# Patient Record
Sex: Female | Born: 1935 | Race: White | Hispanic: No | Marital: Married | Smoking: Never smoker
Health system: Southern US, Community
[De-identification: ages and names within clinical notes are randomized; demographics above are authoritative.]

## PROBLEM LIST (undated history)

## (undated) DIAGNOSIS — E039 Hypothyroidism, unspecified: Secondary | ICD-10-CM

## (undated) DIAGNOSIS — G309 Alzheimer's disease, unspecified: Secondary | ICD-10-CM

## (undated) DIAGNOSIS — F0280 Dementia in other diseases classified elsewhere without behavioral disturbance: Secondary | ICD-10-CM

## (undated) DIAGNOSIS — R296 Repeated falls: Secondary | ICD-10-CM

## (undated) DIAGNOSIS — E785 Hyperlipidemia, unspecified: Secondary | ICD-10-CM

## (undated) HISTORY — PX: ABDOMINAL HYSTERECTOMY: SHX81

## (undated) HISTORY — PX: OTHER SURGICAL HISTORY: SHX169

## (undated) HISTORY — DX: Hyperlipidemia, unspecified: E78.5

## (undated) HISTORY — PX: JOINT REPLACEMENT: SHX530

---

## 1999-10-21 ENCOUNTER — Other Ambulatory Visit: Admission: RE | Admit: 1999-10-21 | Discharge: 1999-10-21 | Payer: Self-pay | Admitting: Family Medicine

## 2000-07-27 ENCOUNTER — Ambulatory Visit (HOSPITAL_COMMUNITY): Admission: RE | Admit: 2000-07-27 | Discharge: 2000-07-27 | Payer: Self-pay | Admitting: Family Medicine

## 2005-04-01 ENCOUNTER — Ambulatory Visit: Payer: Self-pay | Admitting: Cardiology

## 2005-04-03 ENCOUNTER — Ambulatory Visit: Payer: Self-pay | Admitting: Cardiology

## 2005-04-22 ENCOUNTER — Ambulatory Visit: Payer: Self-pay | Admitting: Cardiology

## 2005-10-08 ENCOUNTER — Ambulatory Visit: Payer: Self-pay | Admitting: Cardiology

## 2005-10-09 ENCOUNTER — Inpatient Hospital Stay (HOSPITAL_COMMUNITY): Admission: AD | Admit: 2005-10-09 | Discharge: 2005-10-10 | Payer: Self-pay | Admitting: Cardiology

## 2005-10-09 ENCOUNTER — Ambulatory Visit: Payer: Self-pay | Admitting: Cardiology

## 2005-10-31 ENCOUNTER — Ambulatory Visit: Payer: Self-pay | Admitting: Cardiology

## 2006-02-17 ENCOUNTER — Ambulatory Visit (HOSPITAL_COMMUNITY): Admission: RE | Admit: 2006-02-17 | Discharge: 2006-02-18 | Payer: Self-pay | Admitting: Ophthalmology

## 2006-07-09 ENCOUNTER — Ambulatory Visit: Payer: Self-pay | Admitting: Cardiology

## 2006-07-14 ENCOUNTER — Other Ambulatory Visit: Admission: RE | Admit: 2006-07-14 | Discharge: 2006-07-14 | Payer: Self-pay | Admitting: Family Medicine

## 2006-07-22 ENCOUNTER — Ambulatory Visit: Payer: Self-pay | Admitting: Cardiology

## 2009-02-07 ENCOUNTER — Encounter
Admission: RE | Admit: 2009-02-07 | Discharge: 2009-03-29 | Payer: Self-pay | Admitting: Physical Medicine and Rehabilitation

## 2010-08-16 NOTE — Discharge Summary (Signed)
NAMEMarland Kitchen  [name], Donna [name] NO.:  [id number]   MEDICAL RECORD NO.:  [id number]          PATIENT TYPE:  INP   LOCATION:  4705                         FACILITY:  [facility]   PHYSICIAN:  Dorian Pod, NP    DATE OF BIRTH:  1930-02-15   DATE OF ADMISSION:  10/09/2005  DATE OF DISCHARGE:                                 DISCHARGE SUMMARY   DATE OF DISCHARGE:  October 10, 2005   PRIMARY CARE:  Ernestina Penna, M.D.   CARDIOLOGIST:  Jonelle Sidle, M.D. [facility]   DISCHARGING DIAGNOSIS:  Chest pain status post cardiac catheterization this  admission, show normal coronary arteries, normal left ventricular function.  Moderately severe aortic stenosis.   PROCEDURES THIS ADMISSION:  Cardiac catheterization.   PAST MEDICAL HISTORY:  1.  Lower extremity several years.  2.  Cellulitis.  3.  Hyperlipidemia.  4.  Hypothyroidism.  5.  Bradycardia.   HOSPITAL COURSE:  [name] is a 77 year old Caucasian female initially seen  by Dr. Diona Browner back in January 2007, secondary to chest discomfort.  A 2D  echocardiogram at that time confirmed presence of moderate to moderately  severe aortic stenosis.  Cardiolite stress demonstrated no ischemia.  Patient was placed on aspirin, Lipitor, but no further workup was requested.  This admission, patient transferred from [facility]  to [facility] complaining of chest discomfort.  Dr. Andee Lineman saw  patient in consultation at [facility].  Patient's cardiac enzymes were  negative.  EKG demonstrated sinus brady with a nonspecific ST-T wave  changes.  Dr. Andee Lineman was concerned about patient's substernal chest pain.  He scheduled her for a right and left heart cath at [facility].  Patient was  transferred to [facility] on aspirin and Lovenox.  The patient to the cath  lab on October 09, 2005, for a left and right heart cath.  Hemodynamically,  patient had a cardiac output/cardiac index by Fick of 3.3/1.9, aortic brady  at  18.4 mean, aortic valve area 0.84.  Patient had normal coronaries, normal  ventricular function with moderately severe aortic stenosis.  At this time,  patient's symptoms do not suggest an indication for valve replacement.  Patient will be followed clinically.  Dr. Milinda Cave to see patient on day of  discharge.  Patient is stable.  Hemoglobin 12.6, hematocrit 38, BUN 9,  creatinine 0.8, TSH elevated at 23.597.  Patient in sinus rhythm, afebrile,  blood pressure 118/74.  Patient being discharged home to follow up with  doctor.   PATIENT'S MEDICATIONS AT TIME OF DISCHARGE:  1.  Levoxyl, which has been increased to 175 mcg daily.  Patient has a      prescription for this.  2.  Lipitor 10 mg daily.  3.  Aspirin 81 mg daily.  She has been given the post cardiac catheterization discharge instructions.  She is to follow up with __________  at  12:30.  Follow up with Dr. Christell Constant, patient to call for an appointment.  She  will need to have a TSH level drawn in 4 to 6 weeks. This can be done by Dr.  Moore in [place].  Duration of discharge encounter 25 minutes.      Dorian Pod, NP     MB/MEDQ  D:  10/10/2005  T:  10/11/2005  Job:  [id number]

## 2010-08-16 NOTE — Op Note (Signed)
NAME:  Donna [name], Donna [name]             ACCOUNT NO.:  [id number]   MEDICAL RECORD NO.:  [id number]          PATIENT TYPE:  OIB   LOCATION:  2550                         FACILITY:  [facility]   PHYSICIAN:  Beulah Gandy. Ashley Royalty, M.D. DATE OF BIRTH:  04-Jun-1929   DATE OF PROCEDURE:  02/17/2006  DATE OF DISCHARGE:                                 OPERATIVE REPORT   ADMISSION DIAGNOSIS:  Retained lens fragments in the right eye; glaucoma,  right eye.   PROCEDURES:  Pars plana vitrectomy, retinal photocoagulation, membrane peel,  right eye.   SURGEON:  Alan Mulder, M.D.   ASSISTANT:  Rosalie Doctor, MA   ANESTHESIA:  General.   DETAILS:  Usual prep and drape.  The indirect ophthalmoscope laser was moved  into place, two rows of laser coagulation were placed around the retinal  periphery with a power of 540 milliwatts, 690 burns, 1000 microns each and  0.07 seconds each.  Attention was carried to the pars plana area were 25  gauge trocars were placed for 4 mL behind the limbus at 8, 10 and 2 o'clock.  Infusion at 8 o'clock.  The lighted pick and the cutter were placed at 10  and 2 o'clock respectively.  Pars plana vitrectomy was begun in the  pupillary axis, lens material was seen and 12 o'clock around the IOL.  This  was carefully removed with low suction and rapid cutting.  The vitrectomy is  carried posteriorly where nuclear fragments and cortical fragments were  seen.  These were carefully removed under low suction and rapid cutting.  Scleral depression was used to gain access to the vitreous base area where  additional lens fragments were encountered and removed.  Once all vitreous  and particles were removed.  The instruments were removed from the eye.  The  trocars were removed.  The wounds were held tightly until they were sealed.  Polymyxin and gentamicin were irrigated into Tenon's space.  Marcaine was  injected around the globe for postop pain.  Decadron 10 mg was injected to  the lower  subconjunctival space.  TobraDex ophthalmic ointment, a patch and  shield were placed.  The closing pressure was 15 with a Barraquer tonometer.  Complications none.  Duration 1/2 hour.  The patient is awakened, taken to  recovery in satisfactory condition.      Beulah Gandy. Ashley Royalty, M.D.  Electronically Signed     JDM/MEDQ  D:  02/17/2006  T:  02/17/2006  Job:  [id number]

## 2010-08-16 NOTE — Cardiovascular Report (Signed)
NAME:  Donna [name], Donna [name] NO.:  [id number]   MEDICAL RECORD NO.:  [id number]          PATIENT TYPE:  INP   LOCATION:  4705                         FACILITY:  [facility]   PHYSICIAN:  Rollene Rotunda, M.D.   DATE OF BIRTH:  12-Jan-1930   DATE OF PROCEDURE:  10/09/2005  DATE OF DISCHARGE:                              CARDIAC CATHETERIZATION   PRIMARY CARE PHYSICIAN:  Montey Hora, M.D.   PROCEDURE:  Left and right heart catheterization/coronary angiography.   INDICATIONS FOR PROCEDURE:  Evaluate patient with aortic stenosis, chest  pain suggestive of unstable angina.   DESCRIPTION OF PROCEDURE:  Left heart catheterization was performed via the  right femoral artery, right heart catheterization performed via the right  femoral vein. Both vessels were cannulated, each using the anterior wall  puncture. A #6 French arterial sheath and a #7 [place] venous sheath were  inserted via the modified Seldinger technique. Pre-form Judkins, a pigtail,  and a Swan Ganz catheter were utilized. The patient tolerated the procedure  well and left the lab in stable condition.   RESULTS:  HEMODYNAMICS:  RV mean 12, RV 24/1, PA 22/8 with a mean of 14,  pulmonary capillary wedge pressure mean 6, LV 139/9, AO 116/66, cardiac  output/cardiac index (FICK) 3.3/1.9. Aortic gradient 18.4 mean, aortic valve  area 0.84.   CORONARIES:  The left main was normal. The LAD had mild luminal  irregularities. The first diagonal was moderate size and normal. There was a  circumflex, which was very large. It was essentially an OM and normal. The  right coronary artery was a large dominant vessel. There were diffuse  luminal irregularities. The PDA was moderate size and normal. The  posterolateral was moderate size and normal.   LEFT VENTRICULOGRAM:  The left ventriculogram was obtained in the RAO  projection. The EF was 60% with normal wall motion.   CONCLUSION:  Normal coronary arteries. Normal left  ventricular function.  There is moderately severe aortic stenosis.   PLAN:  I have discussed this with Dr. Andee Lineman. At this point, her symptoms do  not suggest an indication for valve replacement. We can follow this  clinically. She will follow up with Dr. Andee Lineman at the [facility] in [place].           ______________________________  Rollene Rotunda, M.D.     JH/MEDQ  D:  10/09/2005  T:  10/09/2005  Job:  [id number]   cc:   Magnus Sinning. Rice, M.D.  Fax: [phone]   Learta Codding, M.D. [facility]  [address]  [address]  [place]  [place] [address]

## 2010-08-16 NOTE — Assessment & Plan Note (Signed)
[facility]                          [facility] OFFICE NOTE   Donna [name], Donna [name]                    MRN:          [id number]  DATE:07/09/2006                            DOB:          10/21/1929    PRIMARY CARDIOLOGIST:  Dr. Simona Huh.   REASON FOR VISIT:  Scheduled 34-month followup.   Donna [name] is a delightful 77 year old female, with history of moderate  aortic stenosis with normal coronary arteries, who now presents for  scheduled followup.   Since last seen here in the clinic in August of 2007, by Dr. Andee Lineman, the  patient reports no interim development of exertional angina pectoris,  exertional dyspnea, pre-syncope/syncope, or tachy palpitations.  In  fact, she continues to remain quite active both in and out of her home,  and reports that she can climb a flight of stairs with little  difficulty.   The patient did undergo surgery of the neck in September of last year by  Dr. Gabriel Cirri here at [facility], after being diagnosed with  thyroid cancer.  She reports that this continues to remain stable since  her surgery.   The patient had been on low-dose aspirin in the past, but had not  resumed this after her surgery.  She also uses Lasix on a p.r.n. basis  for a long-standing history of chronic lower extremity edema.   ELECTROCARDIOGRAM:  Reveals NSR at 92 BPM with left axis deviation and  no ischemic changes.   CURRENT MEDICATIONS:  1. Lipitor 10 daily.  2. Levoxyl 0.15 daily.  3. Lasix 20 mg 1-2 tablets p.r.n.   PHYSICAL EXAMINATION:  Blood pressure 115/65, pulse 96 and regular,  weight 155.8.  GENERAL:  A 77 year old female sitting upright in no distress.  HEENT:  Normocephalic, atraumatic.  NECK:  Palpable bilateral carotid pulses with no JVD; bilateral  supraclavicular bruits.  LUNGS:  Clear to auscultation in all fields.  HEART:  Regular rate and rhythm (S1, S2), 3/6 crescendo/decrescendo  murmur about mid  peaking with preserved S2.  EXTREMITIES:  Bilateral, non-pitting edema (left greater than right).  NEURO:  No focal deficit.   IMPRESSION:  1. Aortic stenosis.      a.     Moderate by 2D echo (aortic valve area 1.1 cm square),       January 2007.  2. Normal coronary arteries.      a.     Cardiac catheterization July 2007.  3. Mild mitral regurgitation.  4. Hyperlipidemia.  5. Hypothyroidism.  6. Thyroid cancer.      a.     Status post surgery September 2007.  7. Chronic lower extremity edema.   PLAN:  1. Surveillance 2D echo for reassessment of severity of aortic      stenosis.  2. Schedule clinic followup with myself and Dr. Simona Huh in 6      months.  3. The patient is instructed to resume low-dose aspirin.      Rozell Searing, PA-C  Electronically Signed      Jonelle Sidle, MD  Electronically Signed   GS/MedQ  DD: 07/09/2006  DT:  07/09/2006  Job #: [id number]   cc:   Ernestina Penna, M.D.

## 2011-08-13 ENCOUNTER — Other Ambulatory Visit: Payer: Self-pay | Admitting: Family Medicine

## 2011-08-13 DIAGNOSIS — S22080A Wedge compression fracture of T11-T12 vertebra, initial encounter for closed fracture: Secondary | ICD-10-CM

## 2011-08-14 ENCOUNTER — Other Ambulatory Visit: Payer: Self-pay | Admitting: Family Medicine

## 2011-08-14 DIAGNOSIS — IMO0002 Reserved for concepts with insufficient information to code with codable children: Secondary | ICD-10-CM

## 2011-08-15 ENCOUNTER — Ambulatory Visit
Admission: RE | Admit: 2011-08-15 | Discharge: 2011-08-15 | Disposition: A | Discharge status: Home / Self Care | Payer: Self-pay | Source: Ambulatory Visit | Attending: Family Medicine | Admitting: Family Medicine

## 2011-08-15 DIAGNOSIS — S22080A Wedge compression fracture of T11-T12 vertebra, initial encounter for closed fracture: Secondary | ICD-10-CM

## 2011-08-18 ENCOUNTER — Telehealth (HOSPITAL_COMMUNITY): Payer: Self-pay

## 2011-08-18 NOTE — Telephone Encounter (Signed)
Pts son will be coming with his mother

## 2011-08-21 ENCOUNTER — Ambulatory Visit (HOSPITAL_COMMUNITY)
Admission: RE | Admit: 2011-08-21 | Discharge: 2011-08-21 | Disposition: A | Discharge status: Home / Self Care | Payer: Medicare Other | Source: Ambulatory Visit | Attending: Family Medicine | Admitting: Family Medicine

## 2011-08-21 ENCOUNTER — Other Ambulatory Visit (HOSPITAL_COMMUNITY): Payer: Self-pay | Admitting: Interventional Radiology

## 2011-08-21 ENCOUNTER — Other Ambulatory Visit: Payer: Self-pay | Admitting: Radiology

## 2011-08-21 DIAGNOSIS — IMO0002 Reserved for concepts with insufficient information to code with codable children: Secondary | ICD-10-CM

## 2011-08-21 DIAGNOSIS — R29898 Other symptoms and signs involving the musculoskeletal system: Secondary | ICD-10-CM

## 2011-08-22 ENCOUNTER — Encounter (HOSPITAL_COMMUNITY): Payer: Self-pay | Admitting: Pharmacy Technician

## 2011-08-26 ENCOUNTER — Encounter (HOSPITAL_COMMUNITY): Payer: Self-pay

## 2011-08-26 ENCOUNTER — Other Ambulatory Visit (HOSPITAL_COMMUNITY): Payer: Self-pay | Admitting: Interventional Radiology

## 2011-08-26 ENCOUNTER — Ambulatory Visit (HOSPITAL_COMMUNITY)
Admission: RE | Admit: 2011-08-26 | Discharge: 2011-08-26 | Disposition: A | Discharge status: Home / Self Care | Payer: Medicare Other | Source: Ambulatory Visit | Attending: Interventional Radiology | Admitting: Interventional Radiology

## 2011-08-26 DIAGNOSIS — Z9181 History of falling: Secondary | ICD-10-CM | POA: Insufficient documentation

## 2011-08-26 DIAGNOSIS — IMO0002 Reserved for concepts with insufficient information to code with codable children: Secondary | ICD-10-CM

## 2011-08-26 DIAGNOSIS — M8448XA Pathological fracture, other site, initial encounter for fracture: Secondary | ICD-10-CM | POA: Insufficient documentation

## 2011-08-26 DIAGNOSIS — F0280 Dementia in other diseases classified elsewhere without behavioral disturbance: Secondary | ICD-10-CM | POA: Insufficient documentation

## 2011-08-26 DIAGNOSIS — R29898 Other symptoms and signs involving the musculoskeletal system: Secondary | ICD-10-CM

## 2011-08-26 DIAGNOSIS — E039 Hypothyroidism, unspecified: Secondary | ICD-10-CM | POA: Insufficient documentation

## 2011-08-26 DIAGNOSIS — G309 Alzheimer's disease, unspecified: Secondary | ICD-10-CM | POA: Insufficient documentation

## 2011-08-26 DIAGNOSIS — F0390 Unspecified dementia without behavioral disturbance: Secondary | ICD-10-CM | POA: Insufficient documentation

## 2011-08-26 HISTORY — DX: Hypothyroidism, unspecified: E03.9

## 2011-08-26 HISTORY — DX: Dementia in other diseases classified elsewhere, unspecified severity, without behavioral disturbance, psychotic disturbance, mood disturbance, and anxiety: F02.80

## 2011-08-26 HISTORY — DX: Repeated falls: R29.6

## 2011-08-26 HISTORY — DX: Alzheimer's disease, unspecified: G30.9

## 2011-08-26 LAB — BASIC METABOLIC PANEL
BUN: 18 mg/dL (ref 6–23)
CO2: 33 mEq/L — ABNORMAL HIGH (ref 19–32)
Calcium: 9.5 mg/dL (ref 8.4–10.5)
Chloride: 96 mEq/L (ref 96–112)
Creatinine, Ser: 0.8 mg/dL (ref 0.50–1.10)

## 2011-08-26 LAB — APTT: aPTT: 29 seconds (ref 24–37)

## 2011-08-26 LAB — CBC
HCT: 38.4 % (ref 36.0–46.0)
MCH: 30 pg (ref 26.0–34.0)
MCHC: 33.1 g/dL (ref 30.0–36.0)
MCV: 90.8 fL (ref 78.0–100.0)
Platelets: 245 10*3/uL (ref 150–400)
RDW: 14.3 % (ref 11.5–15.5)
WBC: 7.3 10*3/uL (ref 4.0–10.5)

## 2011-08-26 MED ORDER — HYDROMORPHONE HCL PF 1 MG/ML IJ SOLN
INTRAMUSCULAR | Status: AC
  Filled 2011-08-26: qty 2

## 2011-08-26 MED ORDER — CEFAZOLIN SODIUM 1-5 GM-% IV SOLN: 1.0000 g | Freq: Once | INTRAVENOUS | Status: DC

## 2011-08-26 MED ORDER — HYDRALAZINE HCL 20 MG/ML IJ SOLN
INTRAMUSCULAR | Status: AC
  Filled 2011-08-26: qty 1

## 2011-08-26 MED ORDER — MIDAZOLAM HCL 2 MG/2ML IJ SOLN
INTRAMUSCULAR | Status: AC
  Filled 2011-08-26: qty 6

## 2011-08-26 MED ORDER — SODIUM CHLORIDE 0.9 % IV SOLN
Freq: Once | INTRAVENOUS | Status: AC
  Administered 2011-08-26: 50 mL/h via INTRAVENOUS

## 2011-08-26 MED ORDER — TOBRAMYCIN SULFATE 1.2 G IJ SOLR
INTRAMUSCULAR | Status: AC
  Filled 2011-08-26: qty 1.2

## 2011-08-26 MED ORDER — SODIUM CHLORIDE 0.9 % IV SOLN: INTRAVENOUS | Status: AC

## 2011-08-26 MED ORDER — FENTANYL CITRATE 0.05 MG/ML IJ SOLN
INTRAMUSCULAR | Status: AC
  Filled 2011-08-26: qty 4

## 2011-08-26 MED ORDER — FENTANYL CITRATE 0.05 MG/ML IJ SOLN
INTRAMUSCULAR | Status: DC | PRN
  Administered 2011-08-26 (×2): 25 ug via INTRAVENOUS
  Administered 2011-08-26: 50 ug via INTRAVENOUS

## 2011-08-26 MED ORDER — VANCOMYCIN HCL IN DEXTROSE 1-5 GM/200ML-% IV SOLN
1000.0000 mg | Freq: Once | INTRAVENOUS | Status: AC
  Administered 2011-08-26: 1000 mg via INTRAVENOUS

## 2011-08-26 MED ORDER — MIDAZOLAM HCL 5 MG/5ML IJ SOLN
INTRAMUSCULAR | Status: DC | PRN
  Administered 2011-08-26 (×2): 1 mg via INTRAVENOUS

## 2011-08-26 NOTE — Discharge Instructions (Signed)
1. No bending lifting, more than 10 lbs for 2 weeks. 2. Use walker for 2 weeks No driving for 2 weeks. RTC in 2 weeks Kyphoplasty, Balloon Balloon Kyphoplasty is a procedure in which orthopedic balloons are used to gently raise a collapsed vertebral body in an attempt to return it to the correct size and position. This condition is also called a vertebral compression fracture, VCF or VTF. Most often the cause of this collapse is due to osteoporosis of the vertebral body. Osteoporosis is a condition which comes on with aging. Osteoporosis is due to a loss of mineral from the bone. This causes a softening of the bones. The diagnosis of these fractures is usually made with x-rays and or magnetic resonance imaging (MRI). Some advantages of this procedure are:  Restoration of vertebral body height.   Correction of spinal deformity.   Relatively low complication rate.  The procedure is usually done by orthopedic surgeons, neurosurgeons, interventional radiologists, and interventional neuroradiologists who specialize in treating the spine with balloon kyphoplasty.  This procedure is not useful for:  Patients with young, healthy bones or those who have sustained a vertebral body fracture or collapse in a major accident.   Patients with spinal curvature such as scoliosis or kyphosis that is due to causes other than osteoporosis.   Patients who suffer from spinal stenosis or herniated discs with nerve or spinal cord compression, and loss of neurological function not associated with a vertebral compression fracture.   Patients with known metastatic disease of the spine.  THE BENEFITS OF BALLOON KYPHOPLASTY INCLUDE:  Reduction in back pain. If there is pain due to the procedure, it will typically lessen within two weeks.   Improved quality of life.   Improved mobility (you can get around better).   Improved ability to perform activities of daily living.  PROCEDURE  The kyphoplasty procedure  involves the use of a balloon to restore the vertebral body height and shape. This is followed by bone cement to strengthen it. The procedure may be done under intravenous sedation (going to sleep). The patient may need local anesthetic or general anesthetic. The patient lies face-down on the operating room table. Two X-ray machines are used to show the collapsed bones.  The surgeon makes two small (less than 1/8 inch (3mm)) incisions. A tube is then inserted into the center of the vertebral body. Through this tube, balloons are placed in the vertebral body. Then the balloons are inflated. This creates a cavity. This pushes the bone back towards its normal height and shape.  Once the cavity is created, the surgeon removes the inflatable balloon. The cement is mixed and used to fill the cavity in a slow and controlled fashion. The cement hardens. Then the surgeon takes out the tubes. The incisions are closed with a single stitch. Patients usually go home the same day. Patients can go back to all normal activities of daily living as soon as possible. There are no restrictions.  RISKS AND COMPLICATIONS As with any surgery, there are potential risks. Although the procedure is designed to minimize risks, complications may occur. Be sure to discuss the risks with your caregiver. Balloon kyphoplasty is not right for all patients. Complications may require more treatments. Complications that can occur, include:  The usual risks of local or general anesthetics apply. These risks depend on the patient's overall health.   Heart attack (myocardial infarction).   Stroke (cerebrovascular accident).   A blockage in the lung (pulmonary embolism - there is  a very small chance of the cement traveling to lungs).   There is a small risk of the bone cement leaking from within the boundaries of the vertebral body. In most cases, this rare event does not cause any problems. However, if the cement does leak it may cause:    Pain.   Altered sensation.   Very rarely, paralysis.   Should the cement leak further, more significant surgery may be needed to stop the irritation of the nerves or spinal cord.   In very rare circumstances the cement may irritate or damage the spinal cord or nerves.   Heart stops beating (cardiac arrest).   Excessive bleeding (hemorrhage).   Infection (there is a small chance of the cement block becoming infected at the time of surgery or even years later).  Document Released: 02/21/2004 Document Revised: 03/06/2011 Document Reviewed: 09/26/2008 ExitCare Patient Information 2012 [place], [place]    KYPHOPLASTY/VERTEBROPLASTY DISCHARGE INSTRUCTIONS  Medications: (check all that apply)     Resume all home medications as before procedure.       Resume your (aspirin/Plavix/Coumadin) on 08/26/11.                  Continue your pain medications as prescribed as needed.  Over the next 3-5 days, decrease your pain medication as tolerated.  Over the counter medications (i.e. Tylenol, ibuprofen, and aleve) may be substituted once severe/moderate pain symptoms have subsided.   Wound Care:   Bandages may be removed the day following your procedure.  You may get your incision wet once bandages are removed.  Bandaids may be used to cover the incisions until scab formation.  Topical ointments are optional.    If you develop a fever greater than 101 degrees, have increased skin redness at the incision sites or pus-like oozing from incisions occurring within 1 week of the procedure, contact radiology at [phone] or [phone].    Ice pack to back for 15-20 minutes 2-3 time per day for first 2-3 days post procedure.  The ice will expedite muscle healing and help with the pain from the incisions.   Activity:   Bedrest today with limited activity for 24 hours post procedure.    No driving for 48 hours.    Increase your activity as tolerated after bedrest (with assistance if necessary).     Refrain from any strenuous activity or heavy lifting (greater than 10 lbs.).   Follow up:   Contact radiology at [phone] or [phone] if any questions/concerns.    A physician assistant from radiology will contact you in approximately 1 week.    If a biopsy was performed at the time of your procedure, your referring physician should receive the results in usually 2-3 days.        Marland Kitchen

## 2011-08-26 NOTE — H&P (Signed)
Donna [name] is an 78 y.o. female.   Chief Complaint: Painful T12 compression fracture.  HPI: Patient with frequent falls, most recently with continued LBP with minimal relief from narcotics.    Past Medical History  Diagnosis Date  . Alzheimer's dementia   . Hypothyroid   . Falls frequently     Social History:  does not have a smoking history on file. She does not have any smokeless tobacco history on file. Her alcohol and drug histories not on file. Lives with spouse at home. Daughter and son close by who are active in her care.  No tobacco or alcohol use.   Allergies:  Allergies  Allergen Reactions  . Penicillins Other (See Comments)    Reaction unknown  . Sulfa Antibiotics Other (See Comments)    Reaction unknown    Results for orders placed during the hospital encounter of 08/26/11 (from the past 48 hour(s))  APTT     Status: Normal   Collection Time   08/26/11  9:48 AM      Component Value Range Comment   aPTT 29  24 - 37 (seconds)   BASIC METABOLIC PANEL     Status: Abnormal   Collection Time   08/26/11  9:48 AM      Component Value Range Comment   Sodium 138  135 - 145 (mEq/L)    Potassium 4.0  3.5 - 5.1 (mEq/L)    Chloride 96  96 - 112 (mEq/L)    CO2 33 (*) 19 - 32 (mEq/L)    Glucose, Bld 100 (*) 70 - 99 (mg/dL)    BUN 18  6 - 23 (mg/dL)    Creatinine, Ser 1.61  0.50 - 1.10 (mg/dL)    Calcium 9.5  8.4 - 10.5 (mg/dL)    GFR calc non Af Amer 68 (*) >90 (mL/min)    GFR calc Af Amer 79 (*) >90 (mL/min)   CBC     Status: Normal   Collection Time   08/26/11  9:48 AM      Component Value Range Comment   WBC 7.3  4.0 - 10.5 (K/uL)    RBC 4.23  3.87 - 5.11 (MIL/uL)    Hemoglobin 12.7  12.0 - 15.0 (g/dL)    HCT 09.6  04.5 - 40.9 (%)    MCV 90.8  78.0 - 100.0 (fL)    MCH 30.0  26.0 - 34.0 (pg)    MCHC 33.1  30.0 - 36.0 (g/dL)    RDW 81.1  91.4 - 78.2 (%)    Platelets 245  150 - 400 (K/uL)   PROTIME-INR     Status: Normal   Collection Time   08/26/11  9:48 AM      Component Value Range Comment   Prothrombin Time 13.4  11.6 - 15.2 (seconds)    INR 1.00  0.00 - 1.49      Review of Systems  Constitutional: Negative for fever, chills and weight loss.  HENT: Positive for hearing loss.   Eyes: Negative for blurred vision and double vision.  Respiratory: Negative for cough, sputum production and shortness of breath.   Cardiovascular: Positive for leg swelling. Negative for chest pain and palpitations.  Gastrointestinal: Positive for abdominal pain and constipation. Negative for blood in stool.  Genitourinary: Positive for urgency.       Stress incontinence   Musculoskeletal: Positive for back pain, joint pain and falls.  Skin: Negative.   Psychiatric/Behavioral: Positive for depression and memory loss. The patient  has insomnia.     Blood pressure 135/62, pulse 87, temperature 97.1 F (36.2 C), temperature source Oral, resp. rate 16, height 5\' 2"  (1.575 m), weight 135 lb (61.236 kg), SpO2 97.00%. Physical Exam  Constitutional: She is oriented to person, place, and time. She appears well-developed and well-nourished. No distress.  HENT:  Head: Normocephalic and atraumatic.  Eyes: Pupils are equal, round, and reactive to light.  Cardiovascular: Normal rate and regular rhythm.  Exam reveals no gallop and no friction rub.   Murmur heard. Respiratory: Effort normal and breath sounds normal. No respiratory distress. She has no wheezes. She has no rales.  GI: Soft. Bowel sounds are normal. She exhibits no distension. There is no guarding.  Musculoskeletal: Normal range of motion.       Unsteady gait   Neurological: She is alert and oriented to person, place, and time.  Skin: Skin is warm and dry. She is not diaphoretic.  Psychiatric: Her behavior is normal.     Assessment/Plan Patient was seen in consultation with Dr. Corliss Skains in regards to her KP treatment for painful compression fracture.  Potential complications and benefits discussed to their  understanding.  Labs are WNL to proceed today.  Consent obtained from spouse and daughter.   Donna [name] D 08/26/2011, 10:47 AM

## 2011-08-28 ENCOUNTER — Other Ambulatory Visit (HOSPITAL_COMMUNITY): Payer: Self-pay | Admitting: Interventional Radiology

## 2011-08-28 ENCOUNTER — Telehealth (HOSPITAL_COMMUNITY): Payer: Self-pay

## 2011-08-28 DIAGNOSIS — Z09 Encounter for follow-up examination after completed treatment for conditions other than malignant neoplasm: Secondary | ICD-10-CM

## 2011-08-28 DIAGNOSIS — IMO0002 Reserved for concepts with insufficient information to code with codable children: Secondary | ICD-10-CM

## 2011-08-28 NOTE — Telephone Encounter (Signed)
Spoke with Mr Muchmore and made him aware of Mrs. Elson appt.

## 2011-09-09 ENCOUNTER — Ambulatory Visit (HOSPITAL_COMMUNITY)
Admission: RE | Admit: 2011-09-09 | Discharge: 2011-09-09 | Disposition: A | Discharge status: Home / Self Care | Payer: Medicare Other | Source: Ambulatory Visit | Attending: Interventional Radiology | Admitting: Interventional Radiology

## 2011-09-09 DIAGNOSIS — Z09 Encounter for follow-up examination after completed treatment for conditions other than malignant neoplasm: Secondary | ICD-10-CM

## 2011-09-09 DIAGNOSIS — IMO0002 Reserved for concepts with insufficient information to code with codable children: Secondary | ICD-10-CM

## 2012-05-17 ENCOUNTER — Ambulatory Visit (HOSPITAL_COMMUNITY)
Admission: RE | Admit: 2012-05-17 | Discharge: 2012-05-17 | Disposition: A | Discharge status: Home / Self Care | Payer: Medicare Other | Source: Ambulatory Visit | Attending: Urology | Admitting: Urology

## 2012-05-17 DIAGNOSIS — N390 Urinary tract infection, site not specified: Secondary | ICD-10-CM | POA: Insufficient documentation

## 2012-05-17 MED ORDER — SODIUM CHLORIDE 0.9 % IJ SOLN: 10.0000 mL | INTRAMUSCULAR | Status: DC | PRN

## 2012-05-17 MED ORDER — SODIUM CHLORIDE 0.9 % IJ SOLN: 10.0000 mL | Freq: Two times a day (BID) | INTRAMUSCULAR | Status: DC

## 2012-05-17 NOTE — Progress Notes (Signed)
PICC line insertion for chronic recurrent urinary tract infections ordered per Dr. Jerre Simon. 41cm withdrew 3ccm. Pt tolerated well.

## 2012-06-16 ENCOUNTER — Other Ambulatory Visit: Payer: Self-pay | Admitting: *Deleted

## 2012-06-16 MED ORDER — DONEPEZIL HCL 10 MG PO TABS: 5.0000 mg | ORAL_TABLET | Freq: Every day | ORAL | Status: DC

## 2012-06-28 ENCOUNTER — Other Ambulatory Visit: Payer: Self-pay | Admitting: *Deleted

## 2012-06-28 MED ORDER — SIMVASTATIN 40 MG PO TABS: 40.0000 mg | ORAL_TABLET | Freq: Every day | ORAL | Status: DC

## 2012-06-28 MED ORDER — LEVOTHYROXINE SODIUM 150 MCG PO TABS: 150.0000 ug | ORAL_TABLET | Freq: Every day | ORAL | Status: DC

## 2012-06-28 NOTE — Telephone Encounter (Signed)
See last lab, changed to 125 mcg , also should have been rechecked in 6 mo.

## 2012-07-30 ENCOUNTER — Other Ambulatory Visit (INDEPENDENT_AMBULATORY_CARE_PROVIDER_SITE_OTHER): Payer: Medicare Other

## 2012-07-30 ENCOUNTER — Other Ambulatory Visit: Payer: Self-pay | Admitting: *Deleted

## 2012-07-30 ENCOUNTER — Other Ambulatory Visit: Payer: Self-pay | Admitting: Nurse Practitioner

## 2012-07-30 DIAGNOSIS — E785 Hyperlipidemia, unspecified: Secondary | ICD-10-CM

## 2012-07-30 DIAGNOSIS — E039 Hypothyroidism, unspecified: Secondary | ICD-10-CM

## 2012-07-30 LAB — THYROID PANEL WITH TSH
Free Thyroxine Index: 5.6 — ABNORMAL HIGH (ref 1.0–3.9)
T3 Uptake: 36 % (ref 22.5–37.0)
TSH: 0.017 u[IU]/mL — ABNORMAL LOW (ref 0.350–4.500)

## 2012-07-30 LAB — COMPLETE METABOLIC PANEL WITH GFR
ALT: 15 U/L (ref 0–35)
AST: 22 U/L (ref 0–37)
Albumin: 3.7 g/dL (ref 3.5–5.2)
Alkaline Phosphatase: 66 U/L (ref 39–117)
BUN: 13 mg/dL (ref 6–23)
Calcium: 9.3 mg/dL (ref 8.4–10.5)
Chloride: 103 mEq/L (ref 96–112)
Potassium: 4.4 mEq/L (ref 3.5–5.3)
Sodium: 138 mEq/L (ref 135–145)
Total Protein: 6.4 g/dL (ref 6.0–8.3)

## 2012-07-30 MED ORDER — LEVOTHYROXINE SODIUM 150 MCG PO TABS: 150.0000 ug | ORAL_TABLET | Freq: Every day | ORAL | Status: DC

## 2012-07-30 NOTE — Telephone Encounter (Signed)
Has appt to get labs today will get appt with you to follow up please advise

## 2012-08-02 ENCOUNTER — Other Ambulatory Visit: Payer: Self-pay

## 2012-08-02 ENCOUNTER — Other Ambulatory Visit: Payer: Self-pay | Admitting: Nurse Practitioner

## 2012-08-02 MED ORDER — DONEPEZIL HCL 10 MG PO TABS: 5.0000 mg | ORAL_TABLET | Freq: Every day | ORAL | Status: DC

## 2012-08-02 MED ORDER — LEVOTHYROXINE SODIUM 150 MCG PO TABS: 150.0000 ug | ORAL_TABLET | Freq: Every day | ORAL | Status: DC

## 2012-08-02 NOTE — Telephone Encounter (Signed)
Last seen 10/13

## 2012-08-02 NOTE — Telephone Encounter (Signed)
rx sent to pharmacy

## 2012-08-03 ENCOUNTER — Other Ambulatory Visit: Payer: Self-pay | Admitting: Nurse Practitioner

## 2012-08-03 LAB — NMR LIPOPROFILE WITH LIPIDS
Cholesterol, Total: 146 mg/dL (ref <200)
HDL Particle Number: 41.8 umol/L (ref >=30.5)
LDL Particle Number: 773 nmol/L (ref <1000)
Large HDL-P: 14.9 umol/L (ref >=4.8)
Large VLDL-P: <0.8 nmol/L (ref <=2.7)
Small LDL Particle Number: 419 nmol/L (ref <=527)
Triglycerides: 63 mg/dL (ref <150)
VLDL Size: 43.8 nm (ref <=46.6)

## 2012-08-03 MED ORDER — LEVOTHYROXINE SODIUM 137 MCG PO CAPS: 1.0000 | ORAL_CAPSULE | Freq: Every day | ORAL | Status: DC

## 2012-08-04 ENCOUNTER — Ambulatory Visit (INDEPENDENT_AMBULATORY_CARE_PROVIDER_SITE_OTHER): Payer: Medicare Other | Admitting: Nurse Practitioner

## 2012-08-04 ENCOUNTER — Encounter: Payer: Self-pay | Admitting: Nurse Practitioner

## 2012-08-04 VITALS — BP 144/71 | HR 83 | Temp 97.9°F | Ht 60.25 in | Wt 126.0 lb

## 2012-08-04 DIAGNOSIS — E785 Hyperlipidemia, unspecified: Secondary | ICD-10-CM

## 2012-08-04 DIAGNOSIS — E039 Hypothyroidism, unspecified: Secondary | ICD-10-CM

## 2012-08-04 DIAGNOSIS — I10 Essential (primary) hypertension: Secondary | ICD-10-CM | POA: Insufficient documentation

## 2012-08-04 MED ORDER — V-2 HIGH COMPRESSION HOSE MISC: Status: AC

## 2012-08-04 NOTE — Progress Notes (Signed)
Subjective:    Patient ID: Donna [name], female    DOB: [birth year], 79 y.o.   MRN: [id number]  Hyperlipidemia This is a chronic problem. The current episode started more than 1 year ago. The problem is controlled. Recent lipid tests were reviewed and are normal. Exacerbating diseases include hypothyroidism and obesity. Pertinent negatives include no chest pain, focal sensory loss, focal weakness, leg pain or myalgias. Current antihyperlipidemic treatment includes statins. The current treatment provides significant improvement of lipids.  Peripheral edema Lasix keeps swelling down. Still has some edema but usually resolves by AM. Hypothyroidism Levothyroxine recently changed-2 days ago but patient hasn't started taking yet. Dementia aricept is helping some according to husband.     Review of Systems  Constitutional: Negative.   HENT: Negative.   Respiratory: Negative.   Cardiovascular: Positive for leg swelling. Negative for chest pain and palpitations.  Gastrointestinal: Negative.   Genitourinary: Negative.   Musculoskeletal: Negative for myalgias.  Neurological: Negative for focal weakness.  Psychiatric/Behavioral: Negative.        Objective:   Physical Exam  Constitutional: She is oriented to person, place, and time. She appears well-developed and well-nourished.  HENT:  Nose: Nose normal.  Mouth/Throat: Oropharynx is clear and moist.  Eyes: EOM are normal.  Neck: Trachea normal, normal range of motion and full passive range of motion without pain. Neck supple. No JVD present. Carotid bruit is not present. No thyromegaly present.  Cardiovascular: Normal rate, regular rhythm and intact distal pulses.  Exam reveals no gallop and no friction rub.   Murmur (3/6 systolic murmur) heard. Pulmonary/Chest: Effort normal and breath sounds normal.  Abdominal: Soft. Bowel sounds are normal. She exhibits no distension and no mass. There is no tenderness.  Musculoskeletal: Normal  range of motion. She exhibits edema (mild LE edema bil).  Lymphadenopathy:    She has no cervical adenopathy.  Neurological: She is alert and oriented to person, place, and time. She has normal reflexes.  Skin: Skin is warm and dry.  Psychiatric: She has a normal mood and affect. Her behavior is normal. Judgment and thought content normal.   BP 144/71  Pulse 83  Temp(Src) 97.9 F (36.6 C) (Oral)  Ht 5' 0.25" (1.53 m)  Wt 126 lb (57.153 kg)  BMI 24.41 kg/m2        Assessment & Plan:  Hypertension  Hyperlipidemia with target LDL less than 100  Hypothyroidism  Continue current meds:     Medication List       These changes are accurate as of: 08/04/2012 11:37 AM. If you have any questions, ask your nurse or doctor.          STOP taking these medications       HYDROcodone-acetaminophen 5-325 MG per tablet  Commonly known as:  NORCO/VICODIN  Stopped by:  Bennie Pierini, FNP     OVER THE COUNTER MEDICATION  Stopped by:  Bennie Pierini, FNP      TAKE these medications       donepezil 10 MG tablet  Commonly known as:  ARICEPT  Take 0.5 tablets (5 mg total) by mouth at bedtime.     furosemide 40 MG tablet  Commonly known as:  LASIX  Take 40 mg by mouth 2 (two) times daily as needed.     Levothyroxine Sodium 137 MCG Caps  Take 1 capsule (137 mcg total) by mouth daily before breakfast.     simvastatin 40 MG tablet  Commonly known as:  ZOCOR  Take 1  tablet (40 mg total) by mouth at bedtime.     V-2 High Compression Hose Misc  20-30 lb knee high- wear daily  Dx- Venous stasis  Started by:  Bennie Pierini, FNP      labs reviewed at appointment New compression hose ordered Elevate legs when sitting RTO in 3 months F/U  Mary-Margaret Daphine Deutscher, FNP

## 2012-08-30 ENCOUNTER — Other Ambulatory Visit: Payer: Self-pay

## 2012-08-30 MED ORDER — SIMVASTATIN 40 MG PO TABS: 40.0000 mg | ORAL_TABLET | Freq: Every day | ORAL | Status: DC

## 2012-08-30 MED ORDER — DONEPEZIL HCL 10 MG PO TABS: 5.0000 mg | ORAL_TABLET | Freq: Every day | ORAL | Status: DC

## 2012-09-11 ENCOUNTER — Ambulatory Visit (INDEPENDENT_AMBULATORY_CARE_PROVIDER_SITE_OTHER): Payer: Medicare Other | Admitting: General Practice

## 2012-09-11 VITALS — BP 147/73 | HR 82 | Temp 97.0°F | Ht 60.0 in | Wt 126.0 lb

## 2012-09-11 DIAGNOSIS — J309 Allergic rhinitis, unspecified: Secondary | ICD-10-CM

## 2012-09-11 MED ORDER — FLUTICASONE PROPIONATE 50 MCG/ACT NA SUSP: 1.0000 | Freq: Every day | NASAL | Status: AC

## 2012-09-11 NOTE — Patient Instructions (Addendum)
Allergic Rhinitis Allergic rhinitis is when the mucous membranes in the nose respond to allergens. Allergens are particles in the air that cause your body to have an allergic reaction. This causes you to release allergic antibodies. Through a chain of events, these eventually cause you to release histamine into the blood stream (hence the use of antihistamines). Although meant to be protective to the body, it is this release that causes your discomfort, such as frequent sneezing, congestion and an itchy runny nose.  CAUSES  The pollen allergens may come from grasses, trees, and weeds. This is seasonal allergic rhinitis, or "hay fever." Other allergens cause year-round allergic rhinitis (perennial allergic rhinitis) such as house dust mite allergen, pet dander and mold spores.  SYMPTOMS   Nasal stuffiness (congestion).  Runny, itchy nose with sneezing and tearing of the eyes.  There is often an itching of the mouth, eyes and ears. It cannot be cured, but it can be controlled with medications. DIAGNOSIS  If you are unable to determine the offending allergen, skin or blood testing may find it. TREATMENT   Avoid the allergen.  Medications and allergy shots (immunotherapy) can help.  Hay fever may often be treated with antihistamines in pill or nasal spray forms. Antihistamines block the effects of histamine. There are over-the-counter medicines that may help with nasal congestion and swelling around the eyes. Check with your caregiver before taking or giving this medicine. If the treatment above does not work, there are many new medications your caregiver can prescribe. Stronger medications may be used if initial measures are ineffective. Desensitizing injections can be used if medications and avoidance fails. Desensitization is when a patient is given ongoing shots until the body becomes less sensitive to the allergen. Make sure you follow up with your caregiver if problems continue. SEEK MEDICAL  CARE IF:   You develop fever (more than 100.5 F (38.1 C).  You develop a cough that does not stop easily (persistent).  You have shortness of breath.  You start wheezing.  Symptoms interfere with normal daily activities. Document Released: 12/10/2000 Document Revised: 06/09/2011 Document Reviewed: 06/21/2008 Exit[employer] Patient Information 2014 [employer].  

## 2012-09-11 NOTE — Progress Notes (Signed)
  Subjective:    Patient ID: Donna [name], female    DOB: [birth year], 79 y.o.   MRN: [id number]  HPI Presents today with complaints of nose being stopped up. Reports being able to breath through her mouth with out difficulty. Reports having surgery 10 years ago, due to nasal polps, which were removed. Reports being on lasix, but only takes periodically, denies taking as prescribed. Reports nasal stuffiness is worse at night.     Review of Systems  Constitutional: Negative for fever and chills.  HENT: Positive for congestion and postnasal drip. Negative for sore throat, rhinorrhea, sneezing and sinus pressure.   Respiratory: Negative for chest tightness, shortness of breath and wheezing.   Cardiovascular: Negative for chest pain and palpitations.  Neurological: Negative for dizziness, syncope, weakness and headaches.       Objective:   Physical Exam  Constitutional: She is oriented to person, place, and time. She appears well-developed and well-nourished.  HENT:  Nose: Mucosal edema present.  Cardiovascular: Normal rate, regular rhythm and normal heart sounds.   Pulmonary/Chest: Effort normal and breath sounds normal. No respiratory distress. She exhibits no tenderness.  Neurological: She is alert and oriented to person, place, and time.  Skin: Skin is warm and dry.  Psychiatric: She has a normal mood and affect.          Assessment & Plan:  1. Allergic rhinitis - fluticasone (FLONASE) 50 MCG/ACT nasal spray; Place 1 spray into the nose daily.  Dispense: 16 g; Refill: 3 -avoid irritants -call 911 if shortness of breath develops -RTO if symptoms worsen -Patient verbalized understanding -Coralie Keens, FNP-C

## 2012-09-23 ENCOUNTER — Ambulatory Visit (INDEPENDENT_AMBULATORY_CARE_PROVIDER_SITE_OTHER): Payer: Medicare Other | Admitting: Nurse Practitioner

## 2012-09-23 ENCOUNTER — Ambulatory Visit (INDEPENDENT_AMBULATORY_CARE_PROVIDER_SITE_OTHER): Payer: Medicare Other

## 2012-09-23 ENCOUNTER — Encounter: Payer: Self-pay | Admitting: Nurse Practitioner

## 2012-09-23 VITALS — BP 145/72 | HR 95 | Temp 98.8°F | Wt 128.0 lb

## 2012-09-23 DIAGNOSIS — R0602 Shortness of breath: Secondary | ICD-10-CM

## 2012-09-23 DIAGNOSIS — R142 Eructation: Secondary | ICD-10-CM

## 2012-09-23 MED ORDER — METHYLPREDNISOLONE ACETATE 80 MG/ML IJ SUSP
80.0000 mg | Freq: Once | INTRAMUSCULAR | Status: AC
  Administered 2012-09-23: 80 mg via INTRAMUSCULAR

## 2012-09-23 NOTE — Patient Instructions (Signed)
Gastroesophageal Reflux Disease, Adult  Gastroesophageal reflux disease (GERD) happens when acid from your stomach flows up into the esophagus. When acid comes in contact with the esophagus, the acid causes soreness (inflammation) in the esophagus. Over time, GERD may create small holes (ulcers) in the lining of the esophagus.  CAUSES   · Increased body weight. This puts pressure on the stomach, making acid rise from the stomach into the esophagus.  · Smoking. This increases acid production in the stomach.  · Drinking alcohol. This causes decreased pressure in the lower esophageal sphincter (valve or ring of muscle between the esophagus and stomach), allowing acid from the stomach into the esophagus.  · Late evening meals and a full stomach. This increases pressure and acid production in the stomach.  · A malformed lower esophageal sphincter.  Sometimes, no cause is found.  SYMPTOMS   · Burning pain in the lower part of the mid-chest behind the breastbone and in the mid-stomach area. This may occur twice a week or more often.  · Trouble swallowing.  · Sore throat.  · Dry cough.  · Asthma-like symptoms including chest tightness, shortness of breath, or wheezing.  DIAGNOSIS   Your caregiver may be able to diagnose GERD based on your symptoms. In some cases, X-rays and other tests may be done to check for complications or to check the condition of your stomach and esophagus.  TREATMENT   Your caregiver may recommend over-the-counter or prescription medicines to help decrease acid production. Ask your caregiver before starting or adding any new medicines.   HOME CARE INSTRUCTIONS   · Change the factors that you can control. Ask your caregiver for guidance concerning weight loss, quitting smoking, and alcohol consumption.  · Avoid foods and drinks that make your symptoms worse, such as:  · Caffeine or alcoholic drinks.  · Chocolate.  · Peppermint or mint flavorings.  · Garlic and onions.  · Spicy foods.  · Citrus fruits,  such as oranges, lemons, or limes.  · Tomato-based foods such as sauce, chili, salsa, and pizza.  · Fried and fatty foods.  · Avoid lying down for the 3 hours prior to your bedtime or prior to taking a nap.  · Eat small, frequent meals instead of large meals.  · Wear loose-fitting clothing. Do not wear anything tight around your waist that causes pressure on your stomach.  · Raise the head of your bed 6 to 8 inches with wood blocks to help you sleep. Extra pillows will not help.  · Only take over-the-counter or prescription medicines for pain, discomfort, or fever as directed by your caregiver.  · Do not take aspirin, ibuprofen, or other nonsteroidal anti-inflammatory drugs (NSAIDs).  SEEK IMMEDIATE MEDICAL CARE IF:   · You have pain in your arms, neck, jaw, teeth, or back.  · Your pain increases or changes in intensity or duration.  · You develop nausea, vomiting, or sweating (diaphoresis).  · You develop shortness of breath, or you faint.  · Your vomit is green, yellow, black, or looks like coffee grounds or blood.  · Your stool is red, bloody, or black.  These symptoms could be signs of other problems, such as heart disease, gastric bleeding, or esophageal bleeding.  MAKE SURE YOU:   · Understand these instructions.  · Will watch your condition.  · Will get help right away if you are not doing well or get worse.  Document Released: 12/25/2004 Document Revised: 06/09/2011 Document Reviewed: 10/04/2010  Exit[employer]® Patient   Information ©2014 [employer].

## 2012-09-23 NOTE — Progress Notes (Signed)
  Subjective:    Patient ID: Donna [name], female    DOB: 1929/09/06, 79 y.o.   MRN: [id number]  HPI1. Patient in tosday c/o SOB and congestion. SOB mainly occurs at night- says she just can't breathe at night- Started several weeks ago- Came in and saw M.[place]-she gave her a nasal spray which helped a little. SH estopped using because she thought it was bothering her eyes. 2. GERD- has had history of this- now she is burping alot.  Review of Systems  Constitutional: Negative for appetite change, fatigue and unexpected weight change.  HENT: Positive for ear pain, congestion, rhinorrhea and sinus pressure.   Respiratory: Negative for cough.   Cardiovascular: Negative.   Genitourinary: Negative.   Musculoskeletal: Negative.        Objective:   Physical Exam  Constitutional: She appears well-developed and well-nourished.  HENT:  Right Ear: Hearing, tympanic membrane, external ear and ear canal normal.  Left Ear: Hearing, tympanic membrane, external ear and ear canal normal.  Nose: Mucosal edema and rhinorrhea present. Right sinus exhibits no maxillary sinus tenderness and no frontal sinus tenderness. Left sinus exhibits no maxillary sinus tenderness and no frontal sinus tenderness.  Mouth/Throat: Oropharyngeal exudate present. No posterior oropharyngeal edema or posterior oropharyngeal erythema.  Eyes: EOM are normal. Pupils are equal, round, and reactive to light.  Neck: Normal range of motion. Neck supple.  Cardiovascular: Normal rate and normal heart sounds.   Pulmonary/Chest: Effort normal.  Abdominal: Soft.  Musculoskeletal: She exhibits edema (1+ edema bil lower ext.).  Lymphadenopathy:    She has no cervical adenopathy.  Psychiatric: She has a normal mood and affect. Her behavior is normal. Judgment and thought content normal.  BP 145/72  Pulse 95  Temp(Src) 98.8 F (37.1 C) (Oral)  Wt 128 lb (58.06 kg)  BMI 25 kg/m2  SpO2 95% Chest x-ray- cardiomegally-  otherwise negative--Preliminary reading by Paulene Floor, FNP  [facility]         Assessment & Plan:   1. SOB (shortness of breath)   2. Belching    Orders Placed This Encounter  Procedures  . DG Chest 2 View    Standing Status: Future     Number of Occurrences: 1     Standing Expiration Date: 11/23/2013    Order Specific Question:  Reason for Exam (SYMPTOM  OR DIAGNOSIS REQUIRED)    Answer:  sob    Order Specific Question:  Preferred imaging location?    Answer:  Internal  . Helicobacter pylori abs-IgG+IgA, bld  . Brain natriuretic peptide   Continue flonase nasal spray daily Rest AVoid decongestants  Donna Daphine Deutscher, FNP

## 2012-10-04 ENCOUNTER — Ambulatory Visit (INDEPENDENT_AMBULATORY_CARE_PROVIDER_SITE_OTHER): Payer: Medicare Other | Admitting: Nurse Practitioner

## 2012-10-04 ENCOUNTER — Encounter: Payer: Self-pay | Admitting: Nurse Practitioner

## 2012-10-04 VITALS — BP 129/65 | HR 61 | Temp 96.8°F | Ht 60.0 in | Wt 128.0 lb

## 2012-10-04 DIAGNOSIS — E039 Hypothyroidism, unspecified: Secondary | ICD-10-CM

## 2012-10-04 DIAGNOSIS — I10 Essential (primary) hypertension: Secondary | ICD-10-CM

## 2012-10-04 DIAGNOSIS — R0602 Shortness of breath: Secondary | ICD-10-CM

## 2012-10-04 DIAGNOSIS — E785 Hyperlipidemia, unspecified: Secondary | ICD-10-CM

## 2012-10-04 NOTE — Patient Instructions (Addendum)

## 2012-10-04 NOTE — Progress Notes (Signed)
  Subjective:    Patient ID: Donna [name], female    DOB: [birth year], 79 y.o.   MRN: [id number]  Hypertension This is a chronic problem. The current episode started more than 1 year ago. The problem has been resolved since onset. The problem is controlled. Pertinent negatives include no blurred vision, palpitations or shortness of breath. Risk factors for coronary artery disease include dyslipidemia and post-menopausal state. Hypertensive end-organ damage includes a thyroid problem.  Hyperlipidemia This is a chronic problem. The current episode started more than 1 year ago. The problem is controlled. She has no history of diabetes. Pertinent negatives include no shortness of breath. Current antihyperlipidemic treatment includes statins. The current treatment provides mild improvement of lipids. Risk factors for coronary artery disease include dyslipidemia, family history and post-menopausal.  Thyroid Problem Presents for follow-up visit. Patient reports no anxiety, constipation, diarrhea, hair loss or palpitations. The symptoms have been stable. Her past medical history is significant for hyperlipidemia. There is no history of diabetes.  * pateitn actually here for R/U of SOB- was seen over a week ago- doing better.    Review of Systems  Eyes: Negative for blurred vision.  Respiratory: Negative for shortness of breath.   Cardiovascular: Negative for palpitations.  Gastrointestinal: Negative for diarrhea and constipation.  All other systems reviewed and are negative.       Objective:   Physical Exam  Vitals reviewed. Constitutional: She is oriented to person, place, and time. She appears well-developed and well-nourished.  HENT:  Head: Normocephalic.  Neck: Normal range of motion. Neck supple. No thyromegaly present.  Cardiovascular: Normal rate, regular rhythm and intact distal pulses.   Murmur heard. Pulmonary/Chest: Effort normal.  Diminished breath sounds bilaterally     Abdominal: Soft. Bowel sounds are normal. She exhibits no distension. There is no tenderness. There is no rebound.  Musculoskeletal: Normal range of motion. She exhibits edema (+2 edema bilaterally in LE).  Neurological: She is oriented to person, place, and time.  Skin: Skin is warm and dry.  Psychiatric: She has a normal mood and affect. Her behavior is normal. Judgment and thought content normal.     BP 129/65  Pulse 61  Temp(Src) 96.8 F (36 C) (Oral)  Ht 5' (1.524 m)  Wt 128 lb (58.06 kg)  BMI 25 kg/m2      Assessment & Plan:   1. SOB (shortness of breath)   2. Hypertension   3. Hyperlipidemia with target LDL less than 100   4. Hypothyroidism    Contiune all meds Labs not due today Rest Watch diet Follow up in 3 months  Mary-Margaret Daphine Deutscher, FNP

## 2012-11-25 ENCOUNTER — Telehealth: Payer: Self-pay | Admitting: Family Medicine

## 2012-11-25 ENCOUNTER — Other Ambulatory Visit: Payer: Self-pay

## 2012-11-25 MED ORDER — LEVOTHYROXINE SODIUM 137 MCG PO CAPS: 1.0000 | ORAL_CAPSULE | Freq: Every day | ORAL | Status: DC

## 2012-11-25 MED ORDER — DONEPEZIL HCL 10 MG PO TABS: ORAL_TABLET | ORAL | Status: DC

## 2012-11-26 MED ORDER — DONEPEZIL HCL 10 MG PO TABS: 10.0000 mg | ORAL_TABLET | Freq: Every day | ORAL | Status: DC

## 2012-11-26 NOTE — Telephone Encounter (Signed)
done

## 2012-12-06 ENCOUNTER — Encounter: Payer: Self-pay | Admitting: Nurse Practitioner

## 2012-12-06 ENCOUNTER — Ambulatory Visit (INDEPENDENT_AMBULATORY_CARE_PROVIDER_SITE_OTHER): Payer: Medicare Other | Admitting: Nurse Practitioner

## 2012-12-06 VITALS — BP 112/65 | HR 83 | Temp 96.9°F | Ht 60.0 in | Wt 128.0 lb

## 2012-12-06 DIAGNOSIS — E785 Hyperlipidemia, unspecified: Secondary | ICD-10-CM

## 2012-12-06 DIAGNOSIS — R0602 Shortness of breath: Secondary | ICD-10-CM

## 2012-12-06 DIAGNOSIS — E039 Hypothyroidism, unspecified: Secondary | ICD-10-CM

## 2012-12-06 DIAGNOSIS — I10 Essential (primary) hypertension: Secondary | ICD-10-CM

## 2012-12-06 NOTE — Patient Instructions (Addendum)
Shortness of Breath  Shortness of breath means you have trouble breathing. Shortness of breath needs medical care right away.  HOME CARE   · Do not smoke.  · Avoid being around chemicals or things (paint fumes, dust) that may bother your breathing.  · Rest as needed. Slowly begin your normal activities.  · Only take medicines as told by your doctor.  · Keep all doctor visits as told.  GET HELP RIGHT AWAY IF:   · Your shortness of breath gets worse.  · You feel lightheaded, pass out (faint), or have a cough that is not helped by medicine.  · You cough up blood.  · You have pain with breathing.  · You have pain in your chest, arms, shoulders, or belly (abdomen).  · You have a fever.  · You cannot walk up stairs or exercise the way you normally do.  · You do not get better in the time expected.  · You have a hard time doing normal activities even with rest.  · You have problems with your medicines.  · You have any new symptoms.  MAKE SURE YOU:  · Understand these instructions.  · Will watch your condition.  · Will get help right away if you are not doing well or get worse.  Document Released: 09/03/2007 Document Revised: 09/16/2011 Document Reviewed: 06/02/2011  ExitCare® Patient Information ©2014 [employer].

## 2012-12-06 NOTE — Progress Notes (Signed)
Subjective:    Patient ID: Donna [name], female    DOB: July 23, 1929, 79 y.o.   MRN: [id number]  HPI Pt here for f/u on SOB. Pt was seen in office two months for this and pt states it is "about the same". It had been better, but "the rain seems to make it worse" and causes congestion. Pt also states it is worse at nights. Pt states the nasal spray that was prescribed did not help that much.  Hyperlipidemia This is a chronic problem. The current episode started more than 1 year ago. The problem is controlled. Recent lipid tests were reviewed and are normal. Exacerbating diseases include hypothyroidism and obesity. Pertinent negatives include no chest pain, focal sensory loss, focal weakness, leg pain or myalgias. Current antihyperlipidemic treatment includes statins. The current treatment provides significant improvement of lipids.  Peripheral edema Lasix keeps swelling down. Still has some edema but usually resolves by AM. Hypothyroidism Levothyroxine recently changed-2 days ago but patient hasn't started taking yet. Dementia aricept is helping some according to husband.   * home health checked urine and they found UTI and her urologist is going to call in antibiotic for her  Review of Systems  HENT: Positive for rhinorrhea and postnasal drip. Negative for sneezing.   Respiratory: Positive for shortness of breath (DOE). Negative for cough.   All other systems reviewed and are negative.       Objective:   Physical Exam  Constitutional: She is oriented to person, place, and time. She appears well-developed and well-nourished.  HENT:  Head: Normocephalic.  Eyes: Pupils are equal, round, and reactive to light.  Neck: Normal range of motion. Neck supple.  Cardiovascular: Normal rate, regular rhythm, normal heart sounds and intact distal pulses.   Pulmonary/Chest: Effort normal.  Diminshed/Coarse breath sounds heard bilaterally   Abdominal: Soft. Bowel sounds are normal. She exhibits  no distension. There is no tenderness.  Musculoskeletal: Normal range of motion. She exhibits edema (trace amt bil ankles).  Neurological: She is alert and oriented to person, place, and time.  Skin: Skin is warm and dry.  Psychiatric: She has a normal mood and affect. Her behavior is normal. Judgment and thought content normal.    BP 112/65  Pulse 83  Temp(Src) 96.9 F (36.1 C) (Oral)  Ht 5' (1.524 m)  Wt 128 lb (58.06 kg)  BMI 25 kg/m2       Assessment & Plan:   1. SOB (shortness of breath)   2. Hypertension   3. Hyperlipidemia with target LDL less than 100   4. Hypothyroidism    Orders Placed This Encounter  Procedures  . CMP14+EGFR  . NMR, lipoprofile  . Thyroid Panel With TSH   Outpatient Encounter Prescriptions as of 12/06/2012  Medication Sig Dispense Refill  . diphenhydrAMINE (ALLERGY RELIEF) 25 MG tablet Take 25 mg by mouth every 6 (six) hours as needed for itching.      . donepezil (ARICEPT) 10 MG tablet Take 1 tablet (10 mg total) by mouth at bedtime. x  30 tablet  1  . Elastic Bandages & Supports (V-2 HIGH COMPRESSION HOSE) MISC 20-30 lb knee high- wear daily Dx- Venous stasis  2 each  0  . fluticasone (FLONASE) 50 MCG/ACT nasal spray Place 1 spray into the nose daily.  16 g  3  . furosemide (LASIX) 40 MG tablet Take 40 mg by mouth 2 (two) times daily as needed.       . Levothyroxine Sodium 137 MCG CAPS  Take 1 capsule (137 mcg total) by mouth daily before breakfast.  30 capsule  8  . simvastatin (ZOCOR) 40 MG tablet Take 1 tablet (40 mg total) by mouth at bedtime.  30 tablet  5  . darifenacin (ENABLEX) 15 MG 24 hr tablet Take 15 mg by mouth daily.       No facility-administered encounter medications on file as of 12/06/2012.   Continue all meds Labs pending Diet reviewed Health maintneance reviewed Follow up in 3 months  Mary-Margaret Daphine Deutscher, FNP

## 2012-12-08 ENCOUNTER — Other Ambulatory Visit: Payer: Self-pay | Admitting: Nurse Practitioner

## 2012-12-08 LAB — CMP14+EGFR
Albumin/Globulin Ratio: 1.6 (ref 1.1–2.5)
Albumin: 3.9 g/dL (ref 3.5–4.7)
Calcium: 9.3 mg/dL (ref 8.6–10.2)
Creatinine, Ser: 0.78 mg/dL (ref 0.57–1.00)
GFR calc Af Amer: 83 mL/min/{1.73_m2} (ref >59)
GFR calc non Af Amer: 72 mL/min/{1.73_m2} (ref >59)
Globulin, Total: 2.5 g/dL (ref 1.5–4.5)
Glucose: 101 mg/dL — ABNORMAL HIGH (ref 65–99)
Total Protein: 6.4 g/dL (ref 6.0–8.5)

## 2012-12-08 LAB — NMR, LIPOPROFILE
Cholesterol: 139 mg/dL (ref <200)
HDL Cholesterol by NMR: 66 mg/dL (ref >=40)
HDL Particle Number: 38.7 umol/L (ref >=30.5)
Small LDL Particle Number: <90 nmol/L (ref <=527)
Triglycerides by NMR: 76 mg/dL (ref <150)

## 2012-12-08 LAB — THYROID PANEL WITH TSH: T4, Total: 13.1 ug/dL — ABNORMAL HIGH (ref 4.5–12.0)

## 2012-12-08 MED ORDER — LEVOTHYROXINE SODIUM 125 MCG PO TABS
125.0000 ug | ORAL_TABLET | Freq: Every day | ORAL | Status: DC
Start: 2012-12-08 — End: 2013-05-16

## 2012-12-27 ENCOUNTER — Telehealth: Payer: Self-pay | Admitting: Nurse Practitioner

## 2012-12-29 ENCOUNTER — Telehealth: Payer: Self-pay | Admitting: Nurse Practitioner

## 2012-12-30 NOTE — Telephone Encounter (Signed)
lmtcb alf  

## 2012-12-30 NOTE — Telephone Encounter (Signed)
lmtcb alf

## 2013-01-03 ENCOUNTER — Ambulatory Visit (INDEPENDENT_AMBULATORY_CARE_PROVIDER_SITE_OTHER): Payer: Medicare Other

## 2013-01-03 DIAGNOSIS — Z23 Encounter for immunization: Secondary | ICD-10-CM

## 2013-01-06 ENCOUNTER — Other Ambulatory Visit: Payer: Self-pay | Admitting: General Practice

## 2013-02-10 ENCOUNTER — Encounter: Payer: Self-pay | Admitting: Nurse Practitioner

## 2013-02-10 ENCOUNTER — Ambulatory Visit (INDEPENDENT_AMBULATORY_CARE_PROVIDER_SITE_OTHER): Payer: Medicare Other | Admitting: Nurse Practitioner

## 2013-02-10 VITALS — BP 122/64 | HR 66 | Temp 97.0°F

## 2013-02-10 DIAGNOSIS — E785 Hyperlipidemia, unspecified: Secondary | ICD-10-CM

## 2013-02-10 DIAGNOSIS — R3915 Urgency of urination: Secondary | ICD-10-CM | POA: Insufficient documentation

## 2013-02-10 DIAGNOSIS — M6289 Other specified disorders of muscle: Secondary | ICD-10-CM

## 2013-02-10 DIAGNOSIS — Z09 Encounter for follow-up examination after completed treatment for conditions other than malignant neoplasm: Secondary | ICD-10-CM

## 2013-02-10 DIAGNOSIS — I10 Essential (primary) hypertension: Secondary | ICD-10-CM

## 2013-02-10 DIAGNOSIS — E039 Hypothyroidism, unspecified: Secondary | ICD-10-CM

## 2013-02-10 DIAGNOSIS — R29898 Other symptoms and signs involving the musculoskeletal system: Secondary | ICD-10-CM

## 2013-02-10 MED ORDER — DARIFENACIN HYDROBROMIDE ER 15 MG PO TB24: 15.0000 mg | ORAL_TABLET | Freq: Every day | ORAL | Status: DC

## 2013-02-10 NOTE — Progress Notes (Signed)
  Subjective:    Patient ID: Donna [name], female    DOB: 05-19-1929, 79 y.o.   MRN: [id number]  HPI  Patient brought  In by husband for hospital follow up- she was taken to hospital because she was weak and couldn't walk. They kept her in hospital for couple of days then sent her to nursing home for rehab. Patient says that she feels good- getting around better. Hospital ds was sever deconditioning.    Review of Systems  Eyes: Negative.   Respiratory: Negative.   Cardiovascular: Negative.   Gastrointestinal: Negative.   Genitourinary: Negative.   Neurological: Positive for weakness. Dizziness: occasionally.  All other systems reviewed and are negative.       Objective:   Physical Exam  Constitutional: She appears well-developed and well-nourished.  HENT:  Right Ear: External ear normal.  Left Ear: External ear normal.  Nose: Nose normal.  Mouth/Throat: Oropharynx is clear and moist.  Eyes: EOM are normal. Pupils are equal, round, and reactive to light.  Neck: Normal range of motion. Neck supple.  Cardiovascular: Normal rate and regular rhythm.   Murmur (3/6 systolic murmur) heard. Pulmonary/Chest: Effort normal and breath sounds normal.  Abdominal: Soft. Bowel sounds are normal.  Musculoskeletal:  Gait unsteady- walking with walker Compression hose in place  Lymphadenopathy:    She has no cervical adenopathy.  Neurological: She is alert.  Skin: Skin is warm.  Psychiatric: She has a normal mood and affect. Her behavior is normal. Judgment and thought content normal.    BP 122/64  Pulse 66  Temp(Src) 97 F (36.1 C) (Oral)       Assessment & Plan:   1. Hospital discharge follow-up   2. Urinary urgency   3. Hypothyroidism   4. Hypertension   5. Hyperlipidemia with target LDL less than 100   6. Severe muscle deconditioning    Orders Placed This Encounter  Procedures  . Thyroid Panel With TSH  . CMP14+EGFR   Meds ordered this encounter  Medications   . darifenacin (ENABLEX) 15 MG 24 hr tablet    Sig: Take 1 tablet (15 mg total) by mouth daily.    Dispense:  30 tablet    Refill:  5    Order Specific Question:  Supervising Provider    Answer:  Ernestina Penna [[id number]]   Increase protein in diet Labs pending Continue therapy at home Follow up in 3 months Hospital record reviewed  Donna Daphine Deutscher, FNP

## 2013-02-10 NOTE — Patient Instructions (Signed)

## 2013-02-11 ENCOUNTER — Telehealth: Payer: Self-pay | Admitting: Nurse Practitioner

## 2013-02-11 LAB — CMP14+EGFR
ALT: 13 IU/L (ref 0–32)
Alkaline Phosphatase: 61 IU/L (ref 39–117)
Chloride: 95 mmol/L — ABNORMAL LOW (ref 97–108)
GFR calc Af Amer: 73 mL/min/{1.73_m2} (ref >59)
Glucose: 87 mg/dL (ref 65–99)
Potassium: 4.4 mmol/L (ref 3.5–5.2)
Total Bilirubin: 0.3 mg/dL (ref 0.0–1.2)
Total Protein: 6.4 g/dL (ref 6.0–8.5)

## 2013-02-11 LAB — THYROID PANEL WITH TSH
T3 Uptake Ratio: 27 % (ref 24–39)
T4, Total: 12 ug/dL (ref 4.5–12.0)
TSH: 3.99 u[IU]/mL (ref 0.450–4.500)

## 2013-02-11 MED ORDER — HYDROCORTISONE 0.5 % EX CREA: 1.0000 "application " | TOPICAL_CREAM | Freq: Two times a day (BID) | CUTANEOUS | Status: DC

## 2013-02-11 MED ORDER — OXYBUTYNIN CHLORIDE ER 15 MG PO TB24: 15.0000 mg | ORAL_TABLET | Freq: Every day | ORAL | Status: DC

## 2013-02-11 NOTE — Telephone Encounter (Signed)
rx sent to pharmacy- make sure they do not pick up the enablex

## 2013-02-11 NOTE — Telephone Encounter (Signed)
She seen you on 10/13 has had cellulitis in her leg wants steroid cream for her leg. When she was in nursing home took meds for urinary frequency but it cost to much wants to go back on the oxybutin Uses [facility]

## 2013-02-11 NOTE — Telephone Encounter (Signed)
She is on enablex acccording to her chart and that is what I sent in yesterday. What does she need steroid cream for?

## 2013-02-11 NOTE — Telephone Encounter (Signed)
She wants the steriod for the cellulitis in her leg and she wants back on oybutin because it is cheaper for her

## 2013-02-12 NOTE — Telephone Encounter (Signed)
Patient daughter aware. °

## 2013-02-15 ENCOUNTER — Telehealth: Payer: Self-pay | Admitting: Nurse Practitioner

## 2013-02-15 NOTE — Telephone Encounter (Signed)
For the cellulitis in her legs

## 2013-02-15 NOTE — Telephone Encounter (Signed)
Patient aware cream has been called in

## 2013-02-15 NOTE — Telephone Encounter (Signed)
Was sent on 02/11/13

## 2013-03-09 ENCOUNTER — Other Ambulatory Visit: Payer: Self-pay | Admitting: Family Medicine

## 2013-03-09 ENCOUNTER — Other Ambulatory Visit: Payer: Self-pay | Admitting: Nurse Practitioner

## 2013-04-01 ENCOUNTER — Other Ambulatory Visit: Payer: Self-pay | Admitting: Nurse Practitioner

## 2013-05-02 ENCOUNTER — Telehealth: Payer: Self-pay | Admitting: Nurse Practitioner

## 2013-05-02 MED ORDER — INCONTINENCE BRIEF LARGE MISC: 1.0000 | Freq: Four times a day (QID) | Status: AC | PRN

## 2013-05-02 NOTE — Telephone Encounter (Signed)
Incontinence brief rx sent to [facility] pharmacy as requested

## 2013-05-02 NOTE — Telephone Encounter (Signed)
Patient's daughter notified.

## 2013-05-12 ENCOUNTER — Telehealth: Payer: Self-pay | Admitting: Nurse Practitioner

## 2013-05-12 NOTE — Telephone Encounter (Signed)
Talked with husband. He is concerned that she may need an antibiotic. Her appt is with Gennette PacMary Margaret on 05/16/13. She will be out of the office tomorrow. He would rather keep her appt with Gennette PacMary Margaret and will call back if her symptoms worsen. Made him aware that we can schedule her with another provider tomorrow and that someone is available on Saturday mornings.

## 2013-05-16 ENCOUNTER — Encounter: Payer: Self-pay | Admitting: Nurse Practitioner

## 2013-05-16 ENCOUNTER — Ambulatory Visit (INDEPENDENT_AMBULATORY_CARE_PROVIDER_SITE_OTHER): Payer: Commercial Managed Care - HMO | Admitting: Nurse Practitioner

## 2013-05-16 VITALS — BP 120/65 | HR 71 | Temp 97.7°F

## 2013-05-16 DIAGNOSIS — I10 Essential (primary) hypertension: Secondary | ICD-10-CM

## 2013-05-16 DIAGNOSIS — R3915 Urgency of urination: Secondary | ICD-10-CM

## 2013-05-16 DIAGNOSIS — R350 Frequency of micturition: Secondary | ICD-10-CM

## 2013-05-16 DIAGNOSIS — E785 Hyperlipidemia, unspecified: Secondary | ICD-10-CM

## 2013-05-16 DIAGNOSIS — N390 Urinary tract infection, site not specified: Secondary | ICD-10-CM

## 2013-05-16 DIAGNOSIS — E039 Hypothyroidism, unspecified: Secondary | ICD-10-CM

## 2013-05-16 LAB — POCT URINALYSIS DIPSTICK
Bilirubin, UA: NEGATIVE
GLUCOSE UA: NEGATIVE
Ketones, UA: NEGATIVE
Nitrite, UA: NEGATIVE
SPEC GRAV UA: 1.01
Urobilinogen, UA: NEGATIVE
pH, UA: 6

## 2013-05-16 LAB — POCT UA - MICROSCOPIC ONLY
Casts, Ur, LPF, POC: NEGATIVE
Crystals, Ur, HPF, POC: NEGATIVE
Mucus, UA: NEGATIVE
Yeast, UA: NEGATIVE

## 2013-05-16 MED ORDER — DONEPEZIL HCL 10 MG PO TABS: 10.0000 mg | ORAL_TABLET | Freq: Every day | ORAL | Status: DC

## 2013-05-16 MED ORDER — SIMVASTATIN 40 MG PO TABS: ORAL_TABLET | ORAL | Status: DC

## 2013-05-16 MED ORDER — CIPROFLOXACIN HCL 500 MG PO TABS
500.0000 mg | ORAL_TABLET | Freq: Two times a day (BID) | ORAL | Status: DC
Start: 2013-05-16 — End: 2013-05-16

## 2013-05-16 MED ORDER — LEVOTHYROXINE SODIUM 125 MCG PO TABS: 125.0000 ug | ORAL_TABLET | Freq: Every day | ORAL | Status: DC

## 2013-05-16 MED ORDER — CIPROFLOXACIN HCL 500 MG PO TABS: 500.0000 mg | ORAL_TABLET | Freq: Two times a day (BID) | ORAL | Status: DC

## 2013-05-16 NOTE — Addendum Note (Signed)
Addended by: Gwenith DailyHUDY, Dalicia Kisner N on: 05/16/2013 03:06 PM   Modules accepted: Orders

## 2013-05-16 NOTE — Patient Instructions (Signed)
Urinary Tract Infection  Urinary tract infections (UTIs) can develop anywhere along your urinary tract. Your urinary tract is your body's drainage system for removing wastes and extra water. Your urinary tract includes two kidneys, two ureters, a bladder, and a urethra. Your kidneys are a pair of bean-shaped organs. Each kidney is about the size of your fist. They are located below your ribs, one on each side of your spine.  CAUSES  Infections are caused by microbes, which are microscopic organisms, including fungi, viruses, and bacteria. These organisms are so small that they can only be seen through a microscope. Bacteria are the microbes that most commonly cause UTIs.  SYMPTOMS   Symptoms of UTIs may vary by age and gender of the patient and by the location of the infection. Symptoms in young women typically include a frequent and intense urge to urinate and a painful, burning feeling in the bladder or urethra during urination. Older women and men are more likely to be tired, shaky, and weak and have muscle aches and abdominal pain. A fever may mean the infection is in your kidneys. Other symptoms of a kidney infection include pain in your back or sides below the ribs, nausea, and vomiting.  DIAGNOSIS  To diagnose a UTI, your caregiver will ask you about your symptoms. Your caregiver also will ask to provide a urine sample. The urine sample will be tested for bacteria and white blood cells. White blood cells are made by your body to help fight infection.  TREATMENT   Typically, UTIs can be treated with medication. Because most UTIs are caused by a bacterial infection, they usually can be treated with the use of antibiotics. The choice of antibiotic and length of treatment depend on your symptoms and the type of bacteria causing your infection.  HOME CARE INSTRUCTIONS   If you were prescribed antibiotics, take them exactly as your caregiver instructs you. Finish the medication even if you feel better after you  have only taken some of the medication.   Drink enough water and fluids to keep your urine clear or pale yellow.   Avoid caffeine, tea, and carbonated beverages. They tend to irritate your bladder.   Empty your bladder often. Avoid holding urine for long periods of time.   Empty your bladder before and after sexual intercourse.   After a bowel movement, women should cleanse from front to back. Use each tissue only once.  SEEK MEDICAL CARE IF:    You have back pain.   You develop a fever.   Your symptoms do not begin to resolve within 3 days.  SEEK IMMEDIATE MEDICAL CARE IF:    You have severe back pain or lower abdominal pain.   You develop chills.   You have nausea or vomiting.   You have continued burning or discomfort with urination.  MAKE SURE YOU:    Understand these instructions.   Will watch your condition.   Will get help right away if you are not doing well or get worse.  Document Released: 12/25/2004 Document Revised: 09/16/2011 Document Reviewed: 04/25/2011  ExitCare Patient Information 2014 [employer].

## 2013-05-16 NOTE — Progress Notes (Signed)
Subjective:    Patient ID: Donna [name], female    DOB: Aug 18, 1929, 80 y.o.   MRN: [id number]  Hyperlipidemia This is a chronic problem. The current episode started more than 1 year ago. The problem is controlled. Recent lipid tests were reviewed and are normal. Exacerbating diseases include hypothyroidism and obesity. Pertinent negatives include no chest pain, focal sensory loss, focal weakness, leg pain or myalgias. Current antihyperlipidemic treatment includes statins. The current treatment provides significant improvement of lipids.  Peripheral edema Lasix keeps swelling down. Still has some edema but usually resolves by AM. Hypothyroidism Levothyroxine recently changed-2 days ago but patient hasn't started taking yet. Dementia aricept is helping some according to husband.   * c/o urinary frequency- and low grad fever for 3 days  Review of Systems  Constitutional: Negative.   HENT: Negative.   Respiratory: Negative.   Cardiovascular: Positive for leg swelling. Negative for chest pain and palpitations.  Gastrointestinal: Negative.   Genitourinary: Negative.   Musculoskeletal: Negative for myalgias.  Neurological: Negative for focal weakness.  Psychiatric/Behavioral: Negative.        Objective:   Physical Exam  Constitutional: She is oriented to person, place, and time. She appears well-developed and well-nourished.  HENT:  Nose: Nose normal.  Mouth/Throat: Oropharynx is clear and moist.  Eyes: EOM are normal.  Neck: Trachea normal, normal range of motion and full passive range of motion without pain. Neck supple. No JVD present. Carotid bruit is not present. No thyromegaly present.  Cardiovascular: Normal rate, regular rhythm and intact distal pulses.  Exam reveals no gallop and no friction rub.   Murmur (3/6 systolic murmur) heard. Pulmonary/Chest: Effort normal and breath sounds normal.  Abdominal: Soft. Bowel sounds are normal. She exhibits no distension and no  mass. There is no tenderness.  Musculoskeletal: Normal range of motion. She exhibits edema (mild LE edema bilmild left foot).  Lymphadenopathy:    She has no cervical adenopathy.  Neurological: She is alert and oriented to person, place, and time. She has normal reflexes.  Skin: Skin is warm and dry.  Psychiatric: She has a normal mood and affect. Her behavior is normal. Judgment and thought content normal.   BP 120/65  Pulse 71  Temp(Src) 97.7 F (36.5 C) (Oral)        Assessment & Plan:   1. Urinary frequency   2. Hypothyroidism   3. Hypertension   4. Hyperlipidemia with target LDL less than 100   5. Urinary urgency   6. UTI (urinary tract infection)    Orders Placed This Encounter  Procedures  . Urine culture  . CMP14+EGFR  . NMR, lipoprofile  . Thyroid Panel With TSH  . POCT UA - Microscopic Only  . POCT urinalysis dipstick   Meds ordered this encounter  Medications  . calcium carbonate (OS-CAL) 600 MG TABS tablet    Sig: Take 600 mg by mouth 2 (two) times daily with a meal.  . Cholecalciferol 1000 UNITS capsule    Sig: Take 1,000 Units by mouth daily.  . Multiple Vitamins-Minerals (CVS SPECTRAVITE ADULT 50+ PO)    Sig: Take 1 tablet by mouth.  . ciprofloxacin (CIPRO) 500 MG tablet    Sig: Take 1 tablet (500 mg total) by mouth 2 (two) times daily.    Dispense:  14 tablet    Refill:  0    Order Specific Question:  Supervising Provider    Answer:  Chipper Herb [[id number]]  . donepezil (ARICEPT) 10 MG tablet  Sig: Take 1 tablet (10 mg total) by mouth at bedtime. x    Dispense:  30 tablet    Refill:  1    NTBS    Order Specific Question:  Supervising Provider    Answer:  Chipper Herb [[id number]]  . levothyroxine (SYNTHROID, LEVOTHROID) 125 MCG tablet    Sig: Take 1 tablet (125 mcg total) by mouth daily.    Dispense:  90 tablet    Refill:  1    Order Specific Question:  Supervising Provider    Answer:  Chipper Herb [[id number]]  . simvastatin (ZOCOR) 40 MG  tablet    Sig: TAKE ONE TABLET BY MOUTH AT BEDTIME    Dispense:  30 tablet    Refill:  2    Order Specific Question:  Supervising Provider    Answer:  Chipper Herb [[id number]]  refuses health maintenance Labs pending Health maintenance reviewed Diet and exercise encouraged Continue all meds Follow up  In 3 months   [place], FNP

## 2013-05-19 LAB — URINE CULTURE

## 2013-05-23 ENCOUNTER — Telehealth: Payer: Self-pay | Admitting: Nurse Practitioner

## 2013-05-23 ENCOUNTER — Encounter: Payer: Self-pay | Admitting: Family Medicine

## 2013-05-23 ENCOUNTER — Ambulatory Visit (INDEPENDENT_AMBULATORY_CARE_PROVIDER_SITE_OTHER): Payer: Commercial Managed Care - HMO | Admitting: Family Medicine

## 2013-05-23 VITALS — BP 151/72 | HR 83 | Temp 97.1°F | Ht 64.0 in | Wt 142.0 lb

## 2013-05-23 DIAGNOSIS — E785 Hyperlipidemia, unspecified: Secondary | ICD-10-CM

## 2013-05-23 DIAGNOSIS — R52 Pain, unspecified: Secondary | ICD-10-CM | POA: Insufficient documentation

## 2013-05-23 DIAGNOSIS — E039 Hypothyroidism, unspecified: Secondary | ICD-10-CM

## 2013-05-23 DIAGNOSIS — F0390 Unspecified dementia without behavioral disturbance: Secondary | ICD-10-CM | POA: Insufficient documentation

## 2013-05-23 DIAGNOSIS — I10 Essential (primary) hypertension: Secondary | ICD-10-CM

## 2013-05-23 DIAGNOSIS — K117 Disturbances of salivary secretion: Secondary | ICD-10-CM

## 2013-05-23 DIAGNOSIS — R3915 Urgency of urination: Secondary | ICD-10-CM

## 2013-05-23 DIAGNOSIS — R682 Dry mouth, unspecified: Secondary | ICD-10-CM | POA: Insufficient documentation

## 2013-05-23 LAB — POCT INFLUENZA A/B
Influenza A, POC: NEGATIVE
Influenza B, POC: NEGATIVE

## 2013-05-23 NOTE — Telephone Encounter (Signed)
Patient aware.

## 2013-05-23 NOTE — Patient Instructions (Addendum)
Stop oxybutynin, benadryl, and simvastatin. Elderly individuals may not tolerate these medicines and makes her mouthe dry , cause more confusion and the cholesterol medication at this point will not improve her outcome and the 40 mg may be causing muscle aches.  Use vaseline on the lips and ice chips.  Use a humidifier.  Recheck in 2 days with MMM.

## 2013-05-23 NOTE — Progress Notes (Signed)
Patient ID: Donna [name], female   DOB: [birth year], 80 y.o.   MRN: [id number] SUBJECTIVE: CC: Chief Complaint  Patient presents with  . Acute Visit    BODY ACHES  SPOUSE STATED COMPLETED ANTIBIOTIC FOR UTI BY MMM    HPI: Woke up this am and said her body was aching and that she did not feel well. Doesn't remember that she had muscle aches. Now complains of dry mouth. No fever ,no cough. Patient has dementia. She ambulates at home with a walker. Outdoors she uses a wheelchair. Patient is here with her husband who gives the main body of the history. Patient has very poor memory. Past Medical History  Diagnosis Date  . Alzheimer's dementia   . Hypothyroid   . Falls frequently   . Hyperlipidemia    Past Surgical History  Procedure Laterality Date  . Joint replacement Left     broken ankle  . Abdominal hysterectomy    . Bladder tac    . Removed polyp from inside of nose     History   Social History  . Marital Status: Married    Spouse Name: N/A    Number of Children: N/A  . Years of Education: N/A   Occupational History  . Not on file.   Social History Main Topics  . Smoking status: Never Smoker   . Smokeless tobacco: Never Used  . Alcohol Use: No  . Drug Use: No  . Sexual Activity: Not on file   Other Topics Concern  . Not on file   Social History Narrative  . No narrative on file   No family history on file. Current Outpatient Prescriptions on File Prior to Visit  Medication Sig Dispense Refill  . calcium carbonate (OS-CAL) 600 MG TABS tablet Take 600 mg by mouth 2 (two) times daily with a meal.      . Cholecalciferol 1000 UNITS capsule Take 1,000 Units by mouth daily.      Marland Kitchen donepezil (ARICEPT) 10 MG tablet Take 1 tablet (10 mg total) by mouth at bedtime. x  30 tablet  1  . Elastic Bandages & Supports (V-2 HIGH COMPRESSION HOSE) MISC 20-30 lb knee high- wear daily Dx- Venous stasis  2 each  0  . fluticasone (FLONASE) 50 MCG/ACT nasal spray Place 1 spray  into the nose daily.  16 g  3  . furosemide (LASIX) 40 MG tablet TAKE ONE TABLET BY MOUTH TWICE DAILY  60 tablet  2  . Incontinence Supply Disposable (INCONTINENCE BRIEF LARGE) MISC 1 each by Does not apply route 4 (four) times daily as needed.  180 each  11  . levothyroxine (SYNTHROID, LEVOTHROID) 125 MCG tablet Take 1 tablet (125 mcg total) by mouth daily.  90 tablet  1  . Multiple Vitamins-Minerals (CVS SPECTRAVITE ADULT 50+ PO) Take 1 tablet by mouth.       No current facility-administered medications on file prior to visit.   Allergies  Allergen Reactions  . Penicillins Other (See Comments)    Reaction unknown  . Sulfa Antibiotics Other (See Comments)    Reaction unknown   Immunization History  Administered Date(s) Administered  . Influenza,inj,Quad PF,36+ Mos 01/03/2013   Prior to Admission medications   Medication Sig Start Date End Date Taking? Authorizing Provider  calcium carbonate (OS-CAL) 600 MG TABS tablet Take 600 mg by mouth 2 (two) times daily with a meal.   Yes Historical Provider, MD  Cholecalciferol 1000 UNITS capsule Take 1,000 Units by mouth daily.  Yes Historical Provider, MD  diphenhydrAMINE (ALLERGY RELIEF) 25 MG tablet Take 25 mg by mouth every 6 (six) hours as needed for itching.   Yes Historical Provider, MD  donepezil (ARICEPT) 10 MG tablet Take 1 tablet (10 mg total) by mouth at bedtime. x 05/16/13  Yes Mary-Margaret Daphine Deutscher, FNP  Elastic Bandages & Supports (V-2 HIGH COMPRESSION HOSE) MISC 20-30 lb knee high- wear daily Dx- Venous stasis 08/04/12  Yes Mary-Margaret Daphine Deutscher, FNP  fluticasone (FLONASE) 50 MCG/ACT nasal spray Place 1 spray into the nose daily. 09/11/12  Yes Mae Shelda Altes, FNP  furosemide (LASIX) 40 MG tablet TAKE ONE TABLET BY MOUTH TWICE DAILY 04/01/13  Yes Mary-Margaret Daphine Deutscher, FNP  Incontinence Supply Disposable (INCONTINENCE BRIEF LARGE) MISC 1 each by Does not apply route 4 (four) times daily as needed. 05/02/13  Yes Mary-Margaret Daphine Deutscher, FNP   levothyroxine (SYNTHROID, LEVOTHROID) 125 MCG tablet Take 1 tablet (125 mcg total) by mouth daily. 05/16/13  Yes Mary-Margaret Daphine Deutscher, FNP  Multiple Vitamins-Minerals (CVS SPECTRAVITE ADULT 50+ PO) Take 1 tablet by mouth.   Yes Historical Provider, MD  oxybutynin (DITROPAN XL) 15 MG 24 hr tablet Take 1 tablet (15 mg total) by mouth at bedtime. 02/11/13  Yes Mary-Margaret Daphine Deutscher, FNP  simvastatin (ZOCOR) 40 MG tablet TAKE ONE TABLET BY MOUTH AT BEDTIME 05/16/13  Yes Mary-Margaret Daphine Deutscher, FNP     ROS: As above in the HPI. All other systems are stable or negative.  OBJECTIVE: APPEARANCE:  Patient in no acute distress.The patient appeared well nourished and normally developed. Acyanotic. Waist: VITAL SIGNS:BP 151/72  Pulse 83  Temp(Src) 97.1 F (36.2 C) (Oral)  Ht 5\' 4"  (1.626 m)  Wt 142 lb (64.411 kg)  BMI 24.36 kg/m2  SpO2 98% Elderly WF,  Lips dry.  SKIN: warm and  Dry without overt rashes, tattoos and scars  HEAD and Neck: without JVD, Head and scalp: normal Eyes:No scleral icterus. Fundi normal, eye movements normal. Ears: Auricle normal, canal normal, Tympanic membranes normal, insufflation normal. Nose: normal Throat: dry. Neck & thyroid: normal  CHEST & LUNGS: Chest wall: normal Lungs: Clear  CVS: Reveals the PMI to be normally located. Regular rhythm, First and Second Heart sounds are normal,  absence of murmurs, rubs or gallops. Peripheral vasculature: Radial pulses: normal Dorsal pedis pulses: normal Posterior pulses: normal  ABDOMEN:  Appearance: normal Benign, no organomegaly, no masses, no Abdominal Aortic enlargement. No Guarding , no rebound. No Bruits. Bowel sounds: normal  RECTAL: N/A GU: N/A  EXTREMETIES: nonedematous.  NEUROLOGIC: oriented to person; poor memory. She is aware of her husband and recognizes him Strength is generalized weakness Cranial Nerves are normal.  ASSESSMENT: Dementia  Body aches - Plan: POCT Influenza A/B  Dry  mouth  Urinary urgency  Hypothyroidism  Hyperlipidemia with target LDL less than 100  Hypertension  PLAN:  Orders Placed This Encounter  Procedures  . POCT Influenza A/B   Results for orders placed in visit on 05/23/13  POCT INFLUENZA A/B      Result Value Ref Range   Influenza A, POC Negative     Influenza B, POC Negative    Stop oxybutynin, benadryl, and simvastatin. Elderly individuals may not tolerate these medicines and makes her mouth dry , cause more confusion and the cholesterol medication at this point will not improve her outcome and the 40 mg may be causing muscle aches.  Use vaseline on the lips and ice chips.  Use a humidifier.   No orders of the defined types  were placed in this encounter.   Medications Discontinued During This Encounter  Medication Reason  . ciprofloxacin (CIPRO) 500 MG tablet Completed Course  . oxybutynin (DITROPAN XL) 15 MG 24 hr tablet Side effect (s)  . simvastatin (ZOCOR) 40 MG tablet Side effect (s)  . diphenhydrAMINE (ALLERGY RELIEF) 25 MG tablet Side effect (s)   Return in about 2 days (around 05/25/2013) for Recheck medical problems with MMM.  Tenlee Wollin P. [place] CharonWong, M.D.

## 2013-05-23 NOTE — Telephone Encounter (Signed)
No lets just see how she does

## 2013-05-23 NOTE — Telephone Encounter (Signed)
appt made for night clinic  °

## 2013-05-25 ENCOUNTER — Encounter: Payer: Self-pay | Admitting: Nurse Practitioner

## 2013-05-25 ENCOUNTER — Ambulatory Visit (INDEPENDENT_AMBULATORY_CARE_PROVIDER_SITE_OTHER): Payer: Commercial Managed Care - HMO | Admitting: Nurse Practitioner

## 2013-05-25 VITALS — BP 118/74 | HR 63 | Temp 97.6°F

## 2013-05-25 DIAGNOSIS — K117 Disturbances of salivary secretion: Secondary | ICD-10-CM

## 2013-05-25 DIAGNOSIS — R682 Dry mouth, unspecified: Secondary | ICD-10-CM

## 2013-05-25 NOTE — Progress Notes (Signed)
   Subjective:    Patient ID: Donna [name], female    DOB: [birth year], [age] y.o.   MRN: [id number]  HPI Pateint brought in by her husband- was seen 2 days ago by Dr. [place] CharonWong with C/O body aches and dementia and dry mouth- Only thing he really did was stop her zocor, oxybutin and benadryl- She is doing better today- she is getting confused at night and c/o being thirsty.    Review of Systems  Constitutional: Negative.   HENT: Negative.   Respiratory: Negative.   Cardiovascular: Negative.   Gastrointestinal: Negative.   Musculoskeletal: Negative.   Psychiatric/Behavioral: Negative.   All other systems reviewed and are negative.       Objective:   Physical Exam  Constitutional: She is oriented to person, place, and time. She appears well-developed and well-nourished.  Cardiovascular: Normal rate, regular rhythm and normal heart sounds.   Pulmonary/Chest: Effort normal and breath sounds normal.  Neurological: She is alert and oriented to person, place, and time. She has normal reflexes.  Psychiatric: She has a normal mood and affect. Her behavior is normal. Judgment and thought content normal.   BP 118/74  Pulse 63  Temp(Src) 97.6 F (36.4 C) (Oral)        Assessment & Plan:   1. Dry mouth    Rest Continue to hold oxybutin and zocor as told RTO prn  Mary-Margaret Daphine DeutscherMartin, FNP

## 2013-07-06 ENCOUNTER — Telehealth: Payer: Self-pay | Admitting: Nurse Practitioner

## 2013-07-06 MED ORDER — FLUCONAZOLE 150 MG PO TABS: 150.0000 mg | ORAL_TABLET | Freq: Once | ORAL | Status: DC

## 2013-07-06 NOTE — Telephone Encounter (Signed)
Diflucan rx sent to pharmacy.

## 2013-09-20 ENCOUNTER — Other Ambulatory Visit: Payer: Self-pay | Admitting: Nurse Practitioner

## 2013-11-02 ENCOUNTER — Encounter: Payer: Self-pay | Admitting: Family

## 2013-11-02 ENCOUNTER — Ambulatory Visit (INDEPENDENT_AMBULATORY_CARE_PROVIDER_SITE_OTHER): Payer: Commercial Managed Care - HMO

## 2013-11-02 ENCOUNTER — Ambulatory Visit (INDEPENDENT_AMBULATORY_CARE_PROVIDER_SITE_OTHER): Payer: Commercial Managed Care - HMO | Admitting: Family

## 2013-11-02 VITALS — BP 110/70 | HR 99 | Temp 97.9°F

## 2013-11-02 DIAGNOSIS — L293 Anogenital pruritus, unspecified: Secondary | ICD-10-CM

## 2013-11-02 DIAGNOSIS — M7989 Other specified soft tissue disorders: Secondary | ICD-10-CM

## 2013-11-02 DIAGNOSIS — N898 Other specified noninflammatory disorders of vagina: Secondary | ICD-10-CM

## 2013-11-02 LAB — POCT URINALYSIS DIPSTICK
BILIRUBIN UA: NEGATIVE
GLUCOSE UA: NEGATIVE
Ketones, UA: NEGATIVE
Nitrite, UA: POSITIVE
SPEC GRAV UA: 1.015
Urobilinogen, UA: NEGATIVE
pH, UA: 5

## 2013-11-02 LAB — POCT WET PREP (WET MOUNT): Trichomonas Wet Prep HPF POC: NEGATIVE

## 2013-11-02 LAB — POCT UA - MICROSCOPIC ONLY
CRYSTALS, UR, HPF, POC: NEGATIVE
Casts, Ur, LPF, POC: NEGATIVE
MUCUS UA: NEGATIVE

## 2013-11-02 MED ORDER — FLUCONAZOLE 150 MG PO TABS: 150.0000 mg | ORAL_TABLET | Freq: Once | ORAL | Status: DC

## 2013-11-02 MED ORDER — ESTROGENS, CONJUGATED 0.625 MG/GM VA CREA: 1.0000 | TOPICAL_CREAM | Freq: Every day | VAGINAL | Status: AC

## 2013-11-02 NOTE — Patient Instructions (Signed)

## 2013-11-02 NOTE — Addendum Note (Signed)
Addended by: Roselyn ReefMOORE, Kashawn Dirr C on: 11/02/2013 03:27 PM   Modules accepted: Orders

## 2013-11-02 NOTE — Progress Notes (Signed)
   Subjective:    Patient ID: Donna [name], female    DOB: [birth year], 80 y.o.   MRN: [id number]  Vaginal Itching The patient's primary symptoms include genital itching. The patient's pertinent negatives include no vaginal bleeding. This is a recurrent problem. The problem occurs intermittently. The problem has been waxing and waning. The pain is mild. Pertinent negatives include no headaches. She has tried antifungals for the symptoms. The treatment provided mild relief. She is not sexually active.   Pt presents to the office for right leg swelling. Pt states it started swelling after a  Fall a couple weeks ago. Pt states its achy pain when she bears weight or is touched. Pt states when she stands up she "can't walk because her feet and leg hurt". Pt was she has taking some Motrin with mild relief.    Review of Systems  Constitutional: Negative.   HENT: Negative.   Eyes: Negative.   Respiratory: Negative.  Negative for shortness of breath.   Cardiovascular: Negative.  Negative for palpitations.  Gastrointestinal: Negative.   Endocrine: Negative.   Genitourinary: Negative.   Musculoskeletal: Negative.   Neurological: Negative.  Negative for headaches.  Hematological: Negative.   Psychiatric/Behavioral: Negative.   All other systems reviewed and are negative.      Objective:   Physical Exam  Vitals reviewed. Constitutional: She is oriented to person, place, and time. She appears well-developed and well-nourished. No distress.  Cardiovascular: Normal rate, regular rhythm, normal heart sounds and intact distal pulses.   No murmur heard. Pulmonary/Chest: Effort normal and breath sounds normal. No respiratory distress. She has no wheezes.  Abdominal: Soft. Bowel sounds are normal. She exhibits no distension. There is no tenderness.  Musculoskeletal: Normal range of motion. She exhibits edema (2+ edema in righ leg) and tenderness (in right leg to touch ).  Neurological: She is  alert and oriented to person, place, and time. She has normal reflexes. No cranial nerve deficit.  Skin: Skin is warm and dry.  Psychiatric: She has a normal mood and affect. Her behavior is normal. Judgment and thought content normal.    BP 110/70  Pulse 99  Temp(Src) 97.9 F (36.6 C) (Oral)  X-ray wnl- Preliminary reading by Evelina Dun, FNP [facility]      Assessment & Plan:  .1. Leg swelling Rest Ice Naproxen BID OTC-  DG Ankle Complete Right; Future - Brain natriuretic peptide - CMP14+EGFR  2. Vagina itching - POCT Wet Prep [facility]) - POCT UA - Microscopic Only - POCT urinalysis dipstick - fluconazole (DIFLUCAN) 150 MG tablet; Take 1 tablet (150 mg total) by mouth once.  Dispense: 1 tablet; Refill: 1 - conjugated estrogens (PREMARIN) vaginal cream; Place 1 Applicatorful vaginally daily.  Dispense: 42.5 g; Refill: 12   Continue all meds Labs pending RTO prn  Evelina Dun, FNP

## 2013-11-03 ENCOUNTER — Telehealth: Payer: Self-pay | Admitting: Family

## 2013-11-03 ENCOUNTER — Telehealth: Payer: Self-pay | Admitting: *Deleted

## 2013-11-03 ENCOUNTER — Other Ambulatory Visit: Payer: Self-pay | Admitting: Family

## 2013-11-03 LAB — CMP14+EGFR
ALBUMIN: 3.8 g/dL (ref 3.5–4.7)
ALT: 13 IU/L (ref 0–32)
AST: 24 IU/L (ref 0–40)
Albumin/Globulin Ratio: 1.5 (ref 1.1–2.5)
Alkaline Phosphatase: 75 IU/L (ref 39–117)
BILIRUBIN TOTAL: 0.3 mg/dL (ref 0.0–1.2)
BUN/Creatinine Ratio: 18 (ref 11–26)
BUN: 19 mg/dL (ref 8–27)
CALCIUM: 9.2 mg/dL (ref 8.7–10.3)
CO2: 26 mmol/L (ref 18–29)
Chloride: 97 mmol/L (ref 97–108)
Creatinine, Ser: 1.08 mg/dL — ABNORMAL HIGH (ref 0.57–1.00)
GFR, EST AFRICAN AMERICAN: 56 mL/min/{1.73_m2} — AB (ref >59)
GFR, EST NON AFRICAN AMERICAN: 48 mL/min/{1.73_m2} — AB (ref >59)
GLUCOSE: 98 mg/dL (ref 65–99)
Globulin, Total: 2.6 g/dL (ref 1.5–4.5)
Potassium: 4.6 mmol/L (ref 3.5–5.2)
Sodium: 137 mmol/L (ref 134–144)
TOTAL PROTEIN: 6.4 g/dL (ref 6.0–8.5)

## 2013-11-03 LAB — BRAIN NATRIURETIC PEPTIDE: BNP: 49.3 pg/mL (ref 0.0–100.0)

## 2013-11-03 MED ORDER — NITROFURANTOIN MONOHYD MACRO 100 MG PO CAPS: 100.0000 mg | ORAL_CAPSULE | Freq: Two times a day (BID) | ORAL | Status: DC

## 2013-11-03 NOTE — Telephone Encounter (Signed)
Message copied by Doreatha MassedMOORE, MITZI on Thu Nov 03, 2013  2:55 PM ------      Message from: Lendon ColonelHAWKS, [place] A      Created: Thu Nov 03, 2013  2:27 PM       BNP (which checks for CHF) WNL      Kidney and liver function stable       ------

## 2013-11-03 NOTE — Telephone Encounter (Signed)
Message copied by Almeta MonasSTONE, Amye Grego M on Thu Nov 03, 2013  1:17 PM ------      Message from: Lendon ColonelHAWKS, [place] A      Created: Thu Nov 03, 2013 12:32 PM       Urine positive for UTI- rx sent to pharmacy ------

## 2013-11-03 NOTE — Telephone Encounter (Signed)
Aware that mom has uti and medication at pharmacy.

## 2013-11-04 ENCOUNTER — Telehealth: Payer: Self-pay | Admitting: Family

## 2013-11-04 LAB — URINE CULTURE

## 2013-11-04 NOTE — Telephone Encounter (Signed)
Notified daughter

## 2013-11-04 NOTE — Telephone Encounter (Signed)
Message copied by Doreatha MassedMOORE, MITZI on Fri Nov 04, 2013  2:36 PM ------      Message from: Lendon ColonelHAWKS, [place] A      Created: Thu Nov 03, 2013  2:27 PM       BNP (which checks for CHF) WNL      Kidney and liver function stable       ------

## 2013-11-10 ENCOUNTER — Ambulatory Visit (INDEPENDENT_AMBULATORY_CARE_PROVIDER_SITE_OTHER): Payer: Commercial Managed Care - HMO | Admitting: Nurse Practitioner

## 2013-11-10 ENCOUNTER — Encounter: Payer: Self-pay | Admitting: Nurse Practitioner

## 2013-11-10 ENCOUNTER — Ambulatory Visit (INDEPENDENT_AMBULATORY_CARE_PROVIDER_SITE_OTHER): Payer: Commercial Managed Care - HMO

## 2013-11-10 VITALS — BP 112/58 | HR 63 | Temp 97.2°F

## 2013-11-10 DIAGNOSIS — S62609B Fracture of unspecified phalanx of unspecified finger, initial encounter for open fracture: Secondary | ICD-10-CM

## 2013-11-10 DIAGNOSIS — R52 Pain, unspecified: Secondary | ICD-10-CM

## 2013-11-10 DIAGNOSIS — M79609 Pain in unspecified limb: Secondary | ICD-10-CM

## 2013-11-10 NOTE — Patient Instructions (Signed)

## 2013-11-10 NOTE — Progress Notes (Signed)
   Subjective:    Patient ID: Donna [name], female    DOB: 1929/12/02, 80 y.o.   MRN: [id number]  HPI Patient was trying to get up from the bedside commode and fell and injured her right hand. Happened last night. Has bruising and soreness of fingers on right hand.    Review of Systems  Constitutional: Negative.   Respiratory: Negative.   Cardiovascular: Negative.   Neurological: Negative.   Psychiatric/Behavioral: Negative.   All other systems reviewed and are negative.      Objective:   Physical Exam  Constitutional: She is oriented to person, place, and time. She appears well-developed and well-nourished.  Cardiovascular: Normal rate and regular rhythm.   Pulmonary/Chest: Effort normal and breath sounds normal.  Musculoskeletal:  Contusion of right 4th and 5th finger with mild edema at MIP joint of 5th finger- able to make a fist with slight pain.  Neurological: She is alert and oriented to person, place, and time.  Psychiatric: She has a normal mood and affect. Her behavior is normal. Judgment and thought content normal.   BP 112/58  Pulse 63  Temp(Src) 97.2 F (36.2 C) (Oral)   Right hand x ray- displaced fracture of right 5th proximal phalange-Preliminary reading by Paulene FloorMary Hargis Vandyne, FNP  [facility]      Assessment & Plan:   1. Pain   2. Open fracture of phalanx or phalanges of hand, initial encounter    Radiologist nit sure if this is old nor new- he suggest ed to repeat x ray in 1 week Keep in splint until reexamined  Mary-Margaret Daphine DeutscherMartin, FNP

## 2013-11-17 ENCOUNTER — Ambulatory Visit (INDEPENDENT_AMBULATORY_CARE_PROVIDER_SITE_OTHER): Payer: Commercial Managed Care - HMO | Admitting: Nurse Practitioner

## 2013-11-17 ENCOUNTER — Ambulatory Visit (INDEPENDENT_AMBULATORY_CARE_PROVIDER_SITE_OTHER): Payer: Commercial Managed Care - HMO

## 2013-11-17 ENCOUNTER — Encounter: Payer: Self-pay | Admitting: Nurse Practitioner

## 2013-11-17 VITALS — BP 99/52 | HR 79 | Temp 97.0°F

## 2013-11-17 DIAGNOSIS — IMO0001 Reserved for inherently not codable concepts without codable children: Secondary | ICD-10-CM

## 2013-11-17 DIAGNOSIS — S62609D Fracture of unspecified phalanx of unspecified finger, subsequent encounter for fracture with routine healing: Secondary | ICD-10-CM

## 2013-11-17 DIAGNOSIS — T1490XA Injury, unspecified, initial encounter: Secondary | ICD-10-CM

## 2013-11-17 NOTE — Progress Notes (Signed)
   Subjective:    Patient ID: Donna [name], female    DOB: 1930-01-16, 80 y.o.   MRN: [id number]  HPI patient here today for follow up- she was seen one week ago with a fall that injured her right 5th finger- hard to tell on xray if she has and old fracture or new fracture- Here today to x ray again. She says that it is still bothering her at times.     Review of Systems  Constitutional: Negative.   Respiratory: Negative.   Cardiovascular: Negative.   Genitourinary: Negative.   Psychiatric/Behavioral: Negative.   All other systems reviewed and are negative.      Objective:   Physical Exam  Constitutional: She is oriented to person, place, and time. She appears well-developed and well-nourished.  Cardiovascular: Normal rate.   Pulmonary/Chest: Effort normal and breath sounds normal.  Musculoskeletal:  Decreased edema and echymosis of right 4th and 5th finger- FROM without pain.  Neurological: She is alert and oriented to person, place, and time.  Skin: Skin is warm and dry.  Psychiatric: She has a normal mood and affect. Her behavior is normal. Judgment and thought content normal.   BP 99/52  Pulse 79  Temp(Src) 97 F (36.1 C) (Oral)  Right 5th finger x ray- fracture of distal head of medial phalange -Preliminary reading by Paulene FloorMary Jmari Pelc, FNP  [facility]       Assessment & Plan:   1. Injury   2. Fracture of one or more phalanges of hand, with routine healing, subsequent encounter    Due to age - ortho will  Not do anything about fracture Patient told to wear splint- has been taking off a lot Exercise other fingers so they do not get stiff Tylenol OTC as needed  Donna Daphine DeutscherMartin, FNP

## 2013-11-17 NOTE — Patient Instructions (Signed)

## 2013-12-01 ENCOUNTER — Other Ambulatory Visit: Payer: Self-pay | Admitting: Nurse Practitioner

## 2013-12-14 ENCOUNTER — Telehealth: Payer: Self-pay | Admitting: Nurse Practitioner

## 2013-12-14 NOTE — Telephone Encounter (Signed)
appt made

## 2013-12-19 ENCOUNTER — Ambulatory Visit (INDEPENDENT_AMBULATORY_CARE_PROVIDER_SITE_OTHER): Payer: Commercial Managed Care - HMO | Admitting: Nurse Practitioner

## 2013-12-19 ENCOUNTER — Encounter: Payer: Self-pay | Admitting: Nurse Practitioner

## 2013-12-19 VITALS — BP 122/56 | HR 74 | Temp 97.0°F | Ht 64.0 in | Wt 142.0 lb

## 2013-12-19 DIAGNOSIS — L293 Anogenital pruritus, unspecified: Secondary | ICD-10-CM

## 2013-12-19 DIAGNOSIS — L290 Pruritus ani: Secondary | ICD-10-CM

## 2013-12-19 DIAGNOSIS — K625 Hemorrhage of anus and rectum: Secondary | ICD-10-CM

## 2013-12-19 LAB — POCT HEMOGLOBIN: Hemoglobin: 10.4 g/dL — AB (ref 12.2–16.2)

## 2013-12-19 MED ORDER — HEMOCYTE PLUS 106-1 MG PO CAPS: 1.0000 | ORAL_CAPSULE | Freq: Every day | ORAL | Status: AC

## 2013-12-19 MED ORDER — NYSTATIN 100000 UNIT/GM EX CREA: 1.0000 "application " | TOPICAL_CREAM | Freq: Two times a day (BID) | CUTANEOUS | Status: DC

## 2013-12-19 NOTE — Progress Notes (Signed)
   Subjective:    Patient ID: Donna [name], female    DOB: 18-Aug-1929, 80 y.o.   MRN: [id number]  Rectal Bleeding  The current episode started more than 2 weeks ago. The onset is undetermined. The problem occurs occasionally. The problem has been unchanged. The pain is mild. The stool is described as soft (brown. ). There was no prior successful therapy. Associated symptoms include rectal pain, hematuria and vaginal bleeding. Pertinent negatives include no vaginal discharge.  Vaginal Itching The patient's primary symptoms include genital itching and vaginal bleeding. The patient's pertinent negatives include no genital lesions, genital odor or vaginal discharge. This is a recurrent problem. The current episode started 1 to 4 weeks ago. The problem occurs intermittently. The problem has been unchanged. The patient is experiencing no pain. She is not pregnant. Associated symptoms include back pain and hematuria. The vaginal bleeding is spotting. Improvement on treatment: primarin  She is not sexually active. She is postmenopausal.      Review of Systems  Constitutional: Negative.   HENT: Negative.   Eyes: Negative.   Respiratory: Negative.   Cardiovascular: Negative.   Gastrointestinal: Positive for hematochezia and rectal pain.  Endocrine: Negative.   Genitourinary: Positive for hematuria and vaginal bleeding. Negative for vaginal discharge.  Musculoskeletal: Positive for back pain and gait problem.  Skin: Negative.   Allergic/Immunologic: Negative.   Neurological: Negative.   Hematological: Negative.   Psychiatric/Behavioral: Negative.        Objective:   Physical Exam  Constitutional: She is oriented to person, place, and time. She appears well-developed and well-nourished.  HENT:  Head: Normocephalic.  Eyes: Pupils are equal, round, and reactive to light.  Neck: Normal range of motion.  Cardiovascular: Normal rate and regular rhythm.   Abdominal: Soft. Bowel sounds are  normal.  Genitourinary: Vaginal discharge (spotting. ) found.     Musculoskeletal: She exhibits tenderness.  Hx or arthritis, pain with ambulation.   Neurological: She is alert and oriented to person, place, and time.  Skin: Skin is warm and dry.    BP 122/56  Pulse 74  Temp(Src) 97 F (36.1 C) (Oral)  Ht  (1.626 m)  Wt 142 lb (64.411 kg)  BMI 24.36 kg/m2  Results for orders placed in visit on 12/19/13  POCT HEMOGLOBIN      Result Value Ref Range   Hemoglobin 10.4 (*) 12.2 - 16.2 g/dL        Assessment & Plan:  1. Rectal bleeding  - POCT hemoglobin - Ambulatory referral to Gastroenterology  2. Perineal itching, female Avoid scratching - nystatin cream (MYCOSTATIN); Apply 1 application topically 2 (two) times daily.  Dispense: 30 g; Refill: 0  3. anemia - hemocyte plus 1 tablet If GI appointment is more than 2 weeks out need to recheck [facility] next week.  Mary-Margaret Daphine Deutscher, FNP

## 2013-12-19 NOTE — Patient Instructions (Signed)
Rectal Bleeding °Rectal bleeding is when blood passes out of the anus. It is usually a sign that something is wrong. It may not be serious, but it should always be evaluated. Rectal bleeding may present as bright red blood or extremely dark stools. The color may range from dark red or maroon to black (like tar). It is important that the cause of rectal bleeding be identified so treatment can be started and the problem corrected. °CAUSES  °· Hemorrhoids. These are enlarged (dilated) blood vessels or veins in the anal or rectal area. °· Fistulas. These are abnormal, burrowing channels that usually run from inside the rectum to the skin around the anus. They can bleed. °· Anal fissures. This is a tear in the tissue of the anus. Bleeding occurs with bowel movements. °· Diverticulosis. This is a condition in which pockets or sacs project from the bowel wall. Occasionally, the sacs can bleed. °· Diverticulitis. This is an infection involving diverticulosis of the colon. °· Proctitis and colitis. These are conditions in which the rectum, colon, or both, can become inflamed and pitted (ulcerated). °· Polyps and cancer. Polyps are non-cancerous (benign) growths in the colon that may bleed. Certain types of polyps turn into cancer. °· Protrusion of the rectum. Part of the rectum can project from the anus and bleed. °· Certain medicines. °· Intestinal infections. °· Blood vessel abnormalities. °HOME CARE INSTRUCTIONS °· Eat a high-fiber diet to keep your stool soft. °· Limit activity. °· Drink enough fluids to keep your urine clear or pale yellow. °· Warm baths may be useful to soothe rectal pain. °· Follow up with your caregiver as directed. °SEEK IMMEDIATE MEDICAL CARE IF: °· You develop increased bleeding. °· You have black or dark red stools. °· You vomit blood or material that looks like coffee grounds. °· You have abdominal pain or tenderness. °· You have a fever. °· You feel weak, nauseous, or you faint. °· You have  severe rectal pain or you are unable to have a bowel movement. °MAKE SURE YOU: °· Understand these instructions. °· Will watch your condition. °· Will get help right away if you are not doing well or get worse. °Document Released: 09/06/2001 Document Revised: 06/09/2011 Document Reviewed: 09/01/2010 °ExitCare® Patient Information ©2015 [employer]. This information is not intended to replace advice given to you by your health care provider. Make sure you discuss any questions you have with your health care provider. ° °

## 2013-12-21 ENCOUNTER — Other Ambulatory Visit: Payer: Self-pay | Admitting: Nurse Practitioner

## 2013-12-21 ENCOUNTER — Encounter (INDEPENDENT_AMBULATORY_CARE_PROVIDER_SITE_OTHER): Payer: Self-pay | Admitting: *Deleted

## 2013-12-22 ENCOUNTER — Other Ambulatory Visit: Payer: Self-pay | Admitting: Nurse Practitioner

## 2014-01-02 ENCOUNTER — Other Ambulatory Visit (INDEPENDENT_AMBULATORY_CARE_PROVIDER_SITE_OTHER): Payer: Commercial Managed Care - HMO

## 2014-01-02 DIAGNOSIS — Z23 Encounter for immunization: Secondary | ICD-10-CM

## 2014-01-02 DIAGNOSIS — K625 Hemorrhage of anus and rectum: Secondary | ICD-10-CM

## 2014-01-02 LAB — POCT HEMOGLOBIN: Hemoglobin: 13.1 g/dL (ref 12.2–16.2)

## 2014-01-02 NOTE — Addendum Note (Signed)
Addended by: Prescott GumLAND, Stephenia Vogan M on: 01/02/2014 09:09 AM   Modules accepted: Orders

## 2014-01-17 ENCOUNTER — Ambulatory Visit (INDEPENDENT_AMBULATORY_CARE_PROVIDER_SITE_OTHER): Payer: Medicare Other | Admitting: Internal Medicine

## 2014-02-21 ENCOUNTER — Other Ambulatory Visit: Payer: Self-pay | Admitting: Nurse Practitioner

## 2014-02-24 ENCOUNTER — Telehealth: Payer: Self-pay | Admitting: Nurse Practitioner

## 2014-02-24 MED ORDER — CEPHALEXIN 500 MG PO CAPS: 500.0000 mg | ORAL_CAPSULE | Freq: Three times a day (TID) | ORAL | Status: AC

## 2014-02-24 NOTE — Telephone Encounter (Signed)
Pt has pressure wound with  redness and breakdown on sacral area due to wearing Depends with urinary incontinence. Would like RX called into [facility] Please advise

## 2014-02-24 NOTE — Telephone Encounter (Signed)
Antibiotic sent to pharmacy- family aware

## 2014-02-28 ENCOUNTER — Telehealth: Payer: Self-pay | Admitting: Nurse Practitioner

## 2014-02-28 ENCOUNTER — Encounter: Payer: Self-pay | Admitting: Nurse Practitioner

## 2014-02-28 ENCOUNTER — Ambulatory Visit (INDEPENDENT_AMBULATORY_CARE_PROVIDER_SITE_OTHER): Payer: Commercial Managed Care - HMO | Admitting: Nurse Practitioner

## 2014-02-28 VITALS — BP 117/71 | HR 89 | Temp 98.0°F

## 2014-02-28 DIAGNOSIS — B372 Candidiasis of skin and nail: Secondary | ICD-10-CM

## 2014-02-28 MED ORDER — FLUCONAZOLE 150 MG PO TABS: ORAL_TABLET | ORAL | Status: AC

## 2014-02-28 MED ORDER — NYSTATIN 100000 UNIT/GM EX CREA: 1.0000 "application " | TOPICAL_CREAM | Freq: Two times a day (BID) | CUTANEOUS | Status: AC

## 2014-02-28 NOTE — Telephone Encounter (Signed)
Patient is coming today to see Paulene FloorMary Martin, FNP.

## 2014-02-28 NOTE — Patient Instructions (Signed)
Cutaneous Candidiasis Cutaneous candidiasis is a condition in which there is an overgrowth of yeast (candida) on the skin. Yeast normally live on the skin, but in small enough numbers not to cause any symptoms. In certain cases, increased growth of the yeast may cause an actual yeast infection. This kind of infection usually occurs in areas of the skin that are constantly warm and moist, such as the armpits or the groin. Yeast is the most common cause of diaper rash in babies and in people who cannot control their bowel movements (incontinence). CAUSES  The fungus that most often causes cutaneous candidiasis is Candida albicans. Conditions that can increase the risk of getting a yeast infection of the skin include:  Obesity.  Pregnancy.  Diabetes.  Taking antibiotic medicine.  Taking birth control pills.  Taking steroid medicines.  Thyroid disease.  An iron or zinc deficiency.  Problems with the immune system. SYMPTOMS   Red, swollen area of the skin.  Bumps on the skin.  Itchiness. DIAGNOSIS  The diagnosis of cutaneous candidiasis is usually based on its appearance. Light scrapings of the skin may also be taken and viewed under a microscope to identify the presence of yeast. TREATMENT  Antifungal creams may be applied to the infected skin. In severe cases, oral medicines may be needed.  HOME CARE INSTRUCTIONS   Keep your skin clean and dry.  Maintain a healthy weight.  If you have diabetes, keep your blood sugar under control. SEEK IMMEDIATE MEDICAL CARE IF:  Your rash continues to spread despite treatment.  You have a fever, chills, or abdominal pain. Document Released: 12/03/2010 Document Revised: 06/09/2011 Document Reviewed: 12/03/2010 ExitCare Patient Information 2015 [employer]. This information is not intended to replace advice given to you by your health care provider. Make sure you discuss any questions you have with your health care provider.  

## 2014-02-28 NOTE — Progress Notes (Signed)
   Subjective:    Patient ID: Donna BurkeGeraldine M Capri, female    DOB: 27-Jun-1929, 80 y.o.   MRN: [id number]  HPI Patient is complaining of vaginal rash and irritation that has gotten to her groin and inner thigh. She reports it started over a month ago, it has been fluctuating and lately it has gotten worse. She reports itching, burning with urination and pain. She has tried bomex A&D ointment and antibiotic cream OTC. She has also been using premarin and nystatin cream with no relief.    Review of Systems  HENT: Negative.   Respiratory: Negative.   Cardiovascular: Negative.   Gastrointestinal: Negative.   Genitourinary: Positive for dysuria and vaginal pain.  Musculoskeletal:       Wheelchair.  Skin: Positive for rash (vaginal and groin. ).  Neurological: Negative.   Hematological: Negative.   Psychiatric/Behavioral: Negative.   All other systems reviewed and are negative.      Objective:   Physical Exam  Constitutional: She is oriented to person, place, and time. She appears well-developed and well-nourished.  HENT:  Head: Normocephalic.  Eyes: Pupils are equal, round, and reactive to light.  Neck: Normal range of motion.  Cardiovascular: Normal rate.  Exam reveals no gallop and no friction rub.   No murmur heard. Pulmonary/Chest: Effort normal.  Genitourinary:  Erythematous moist apperaing  groin and perineal area.  Musculoskeletal: Normal range of motion.  Neurological: She is alert and oriented to person, place, and time.  Skin: Skin is warm.  Psychiatric: She has a normal mood and affect. Her behavior is normal. Judgment and thought content normal.   BP 117/71 mmHg  Pulse 89  Temp(Src) 98 F (36.7 C) (Oral)  Wt        Assessment & Plan:   1. Cutaneous candidiasis    Meds ordered this encounter  Medications  . nystatin cream (MYCOSTATIN)    Sig: Apply 1 application topically 2 (two) times daily.    Dispense:  30 g    Refill:  0    Order Specific Question:   Supervising Provider    Answer:  Ernestina PennaMOORE, DONALD W [[id number]]  . fluconazole (DIFLUCAN) 150 MG tablet    Sig: 1 po q week x 4 weeks    Dispense:  4 tablet    Refill:  0    Order Specific Question:  Supervising Provider    Answer:  Deborra MedinaMOORE, DONALD W [[id number]]   Keep area clean and dry Gold bond powder rto prn  Mary-Margaret Daphine DeutscherMartin, FNP

## 2014-03-21 ENCOUNTER — Other Ambulatory Visit: Payer: Self-pay | Admitting: Nurse Practitioner

## 2014-03-21 ENCOUNTER — Telehealth: Payer: Self-pay | Admitting: Nurse Practitioner

## 2014-03-21 DIAGNOSIS — L293 Anogenital pruritus, unspecified: Secondary | ICD-10-CM

## 2014-03-21 MED ORDER — TRIAMCINOLONE ACETONIDE 0.5 % EX OINT
1.0000 | TOPICAL_OINTMENT | Freq: Two times a day (BID) | CUTANEOUS | Status: AC
Start: 2014-03-21 — End: ?

## 2014-03-21 MED ORDER — NYSTATIN 100000 UNIT/GM EX CREA: 1.0000 "application " | TOPICAL_CREAM | Freq: Two times a day (BID) | CUTANEOUS | Status: AC

## 2014-03-21 NOTE — Telephone Encounter (Signed)
rx snet to pharmacy 

## 2014-06-30 DEATH — deceased

## 2014-08-20 IMAGING — CR DG ANKLE COMPLETE 3+V*R*
3 series · 3 of 3 positions shown · non-contrast
Comparison: None.

CLINICAL DATA: Right ankle pain and swelling

EXAM:
RIGHT ANKLE - COMPLETE 3+ VIEW

[view not recorded (1 of 3)]
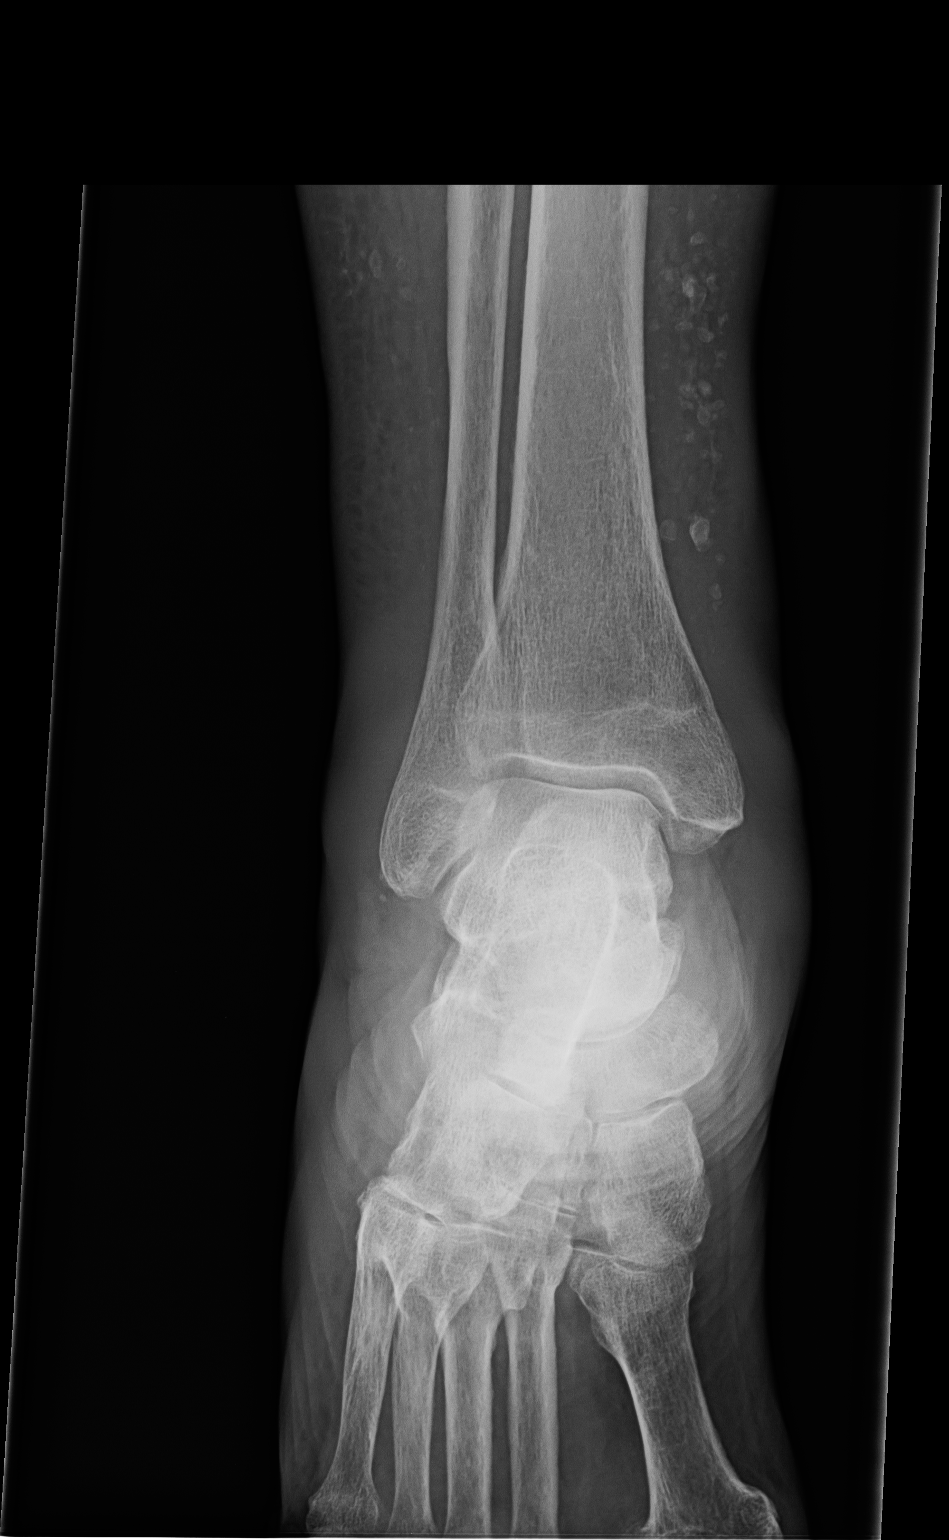

[view not recorded (2 of 3)]
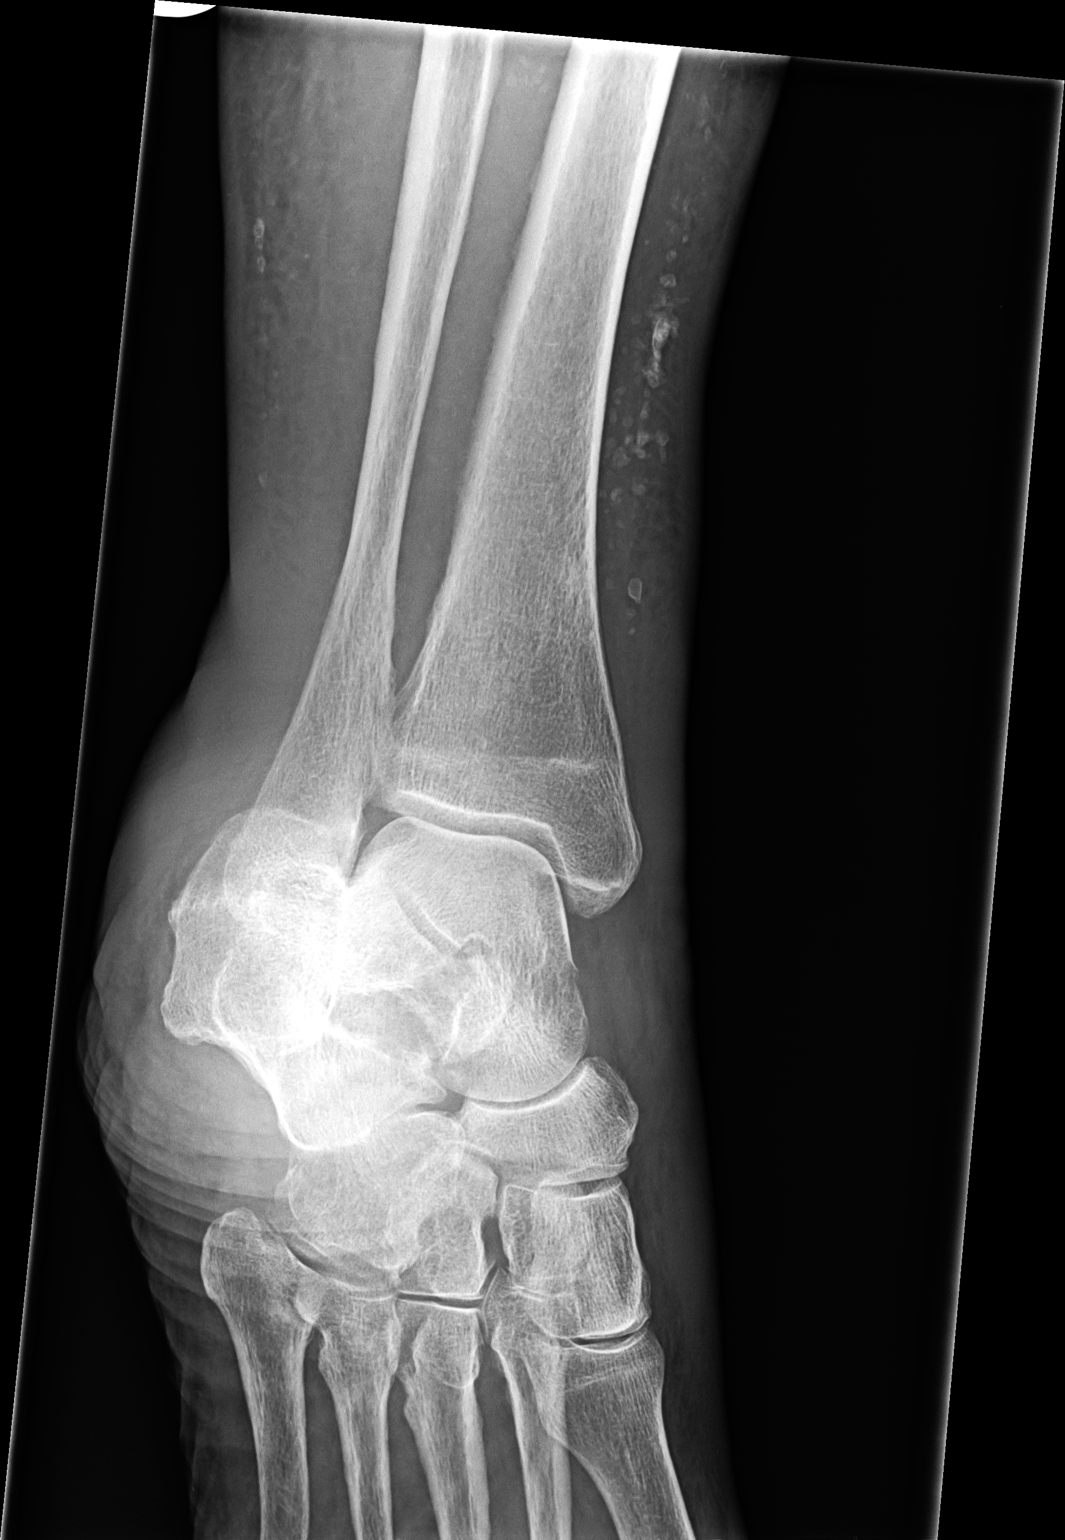

[view not recorded (3 of 3)]
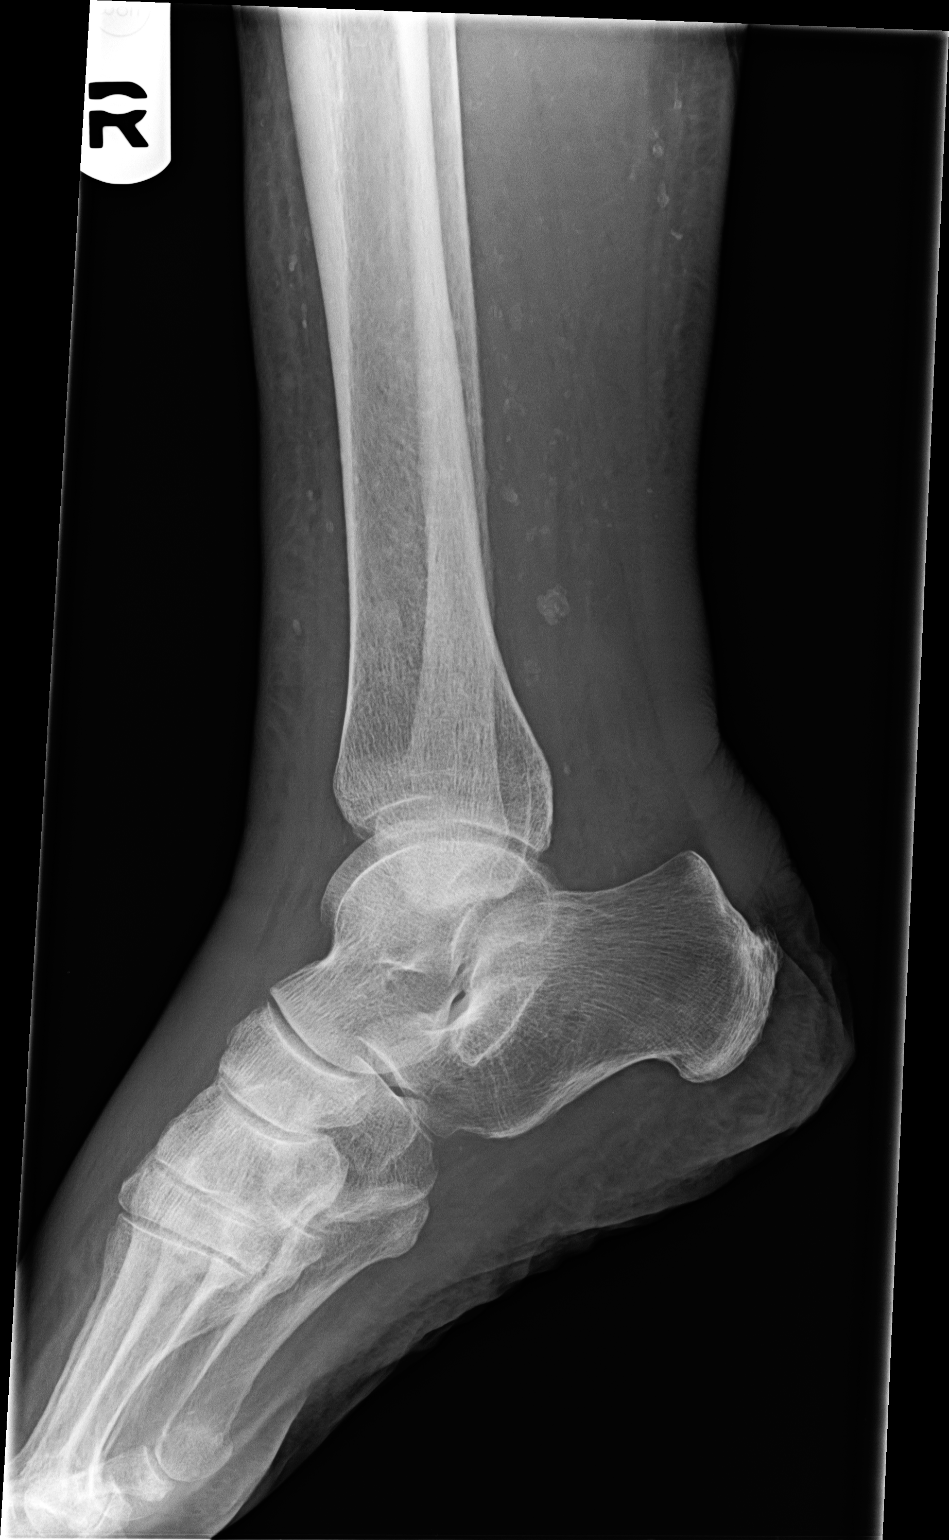

[3 of 3 positions shown; findings below may reference images not displayed]

FINDINGS: Diffuse soft tissue swelling is noted. Multiple phleboliths are
noted within the lower leg. No acute fracture or dislocation is
noted.
IMPRESSION: Soft tissue swelling without acute bony abnormality.

## 2014-09-04 IMAGING — CR DG HAND COMPLETE 3+V*R*
3 series · 3 of 3 positions shown · non-contrast
Comparison: 11/10/2013

CLINICAL DATA: Fracture

EXAM:
RIGHT HAND - COMPLETE 3+ VIEW

[view not recorded (1 of 3)]
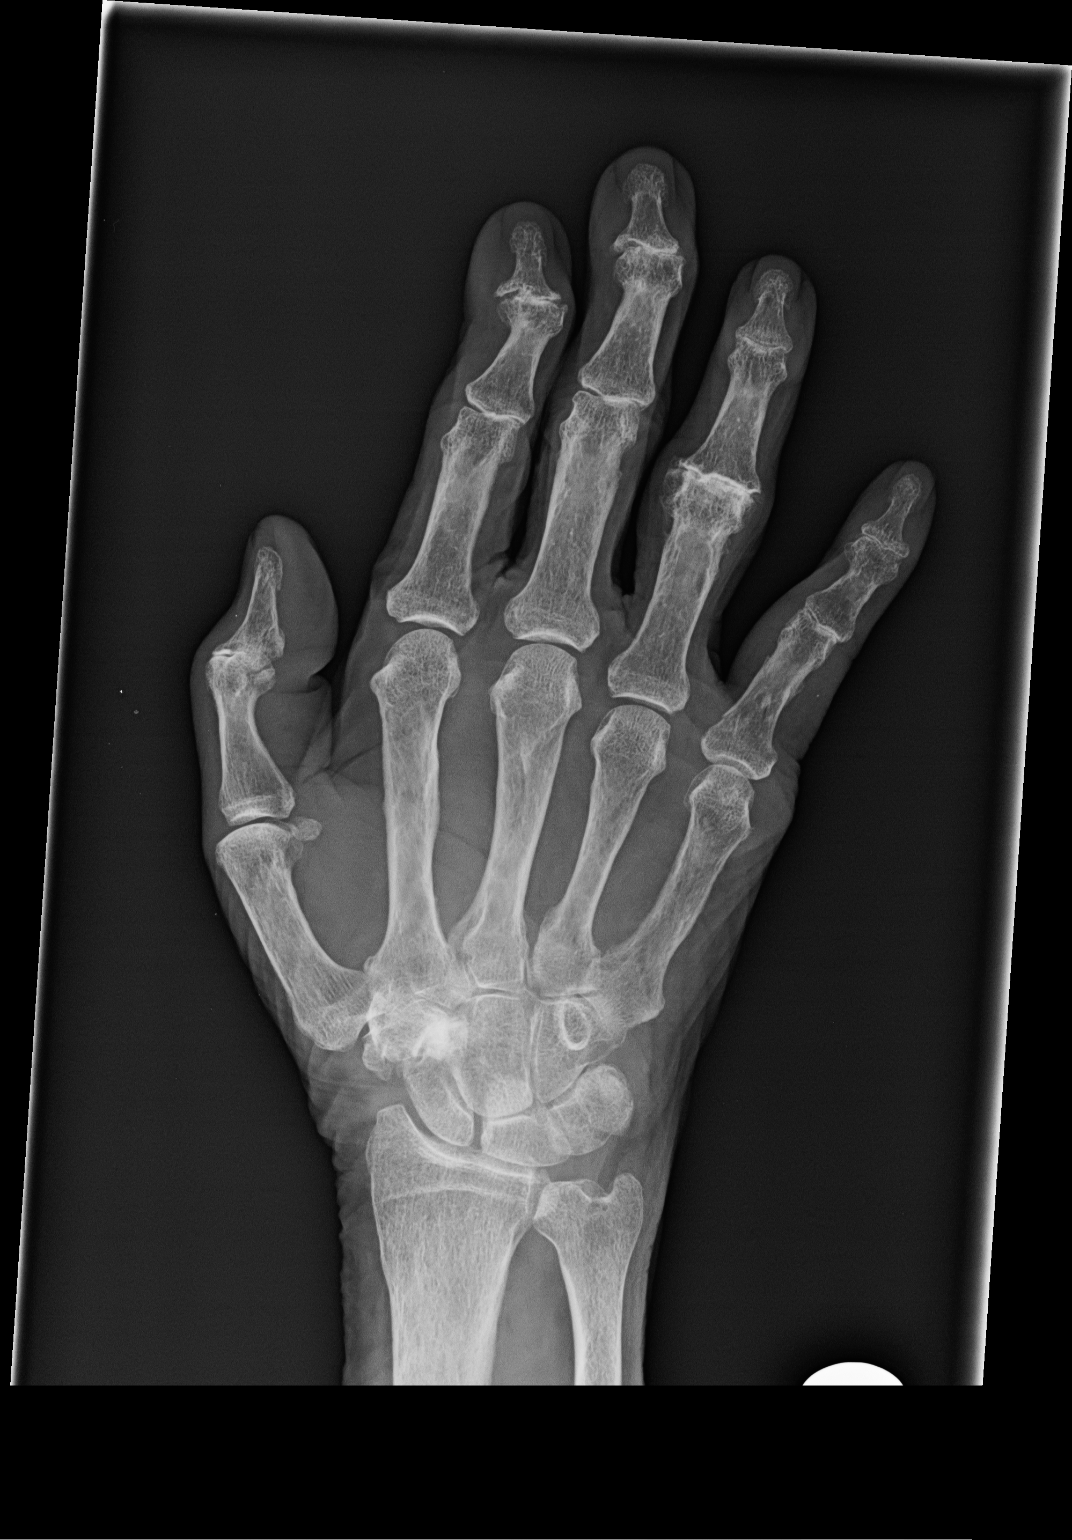

[view not recorded (2 of 3)]
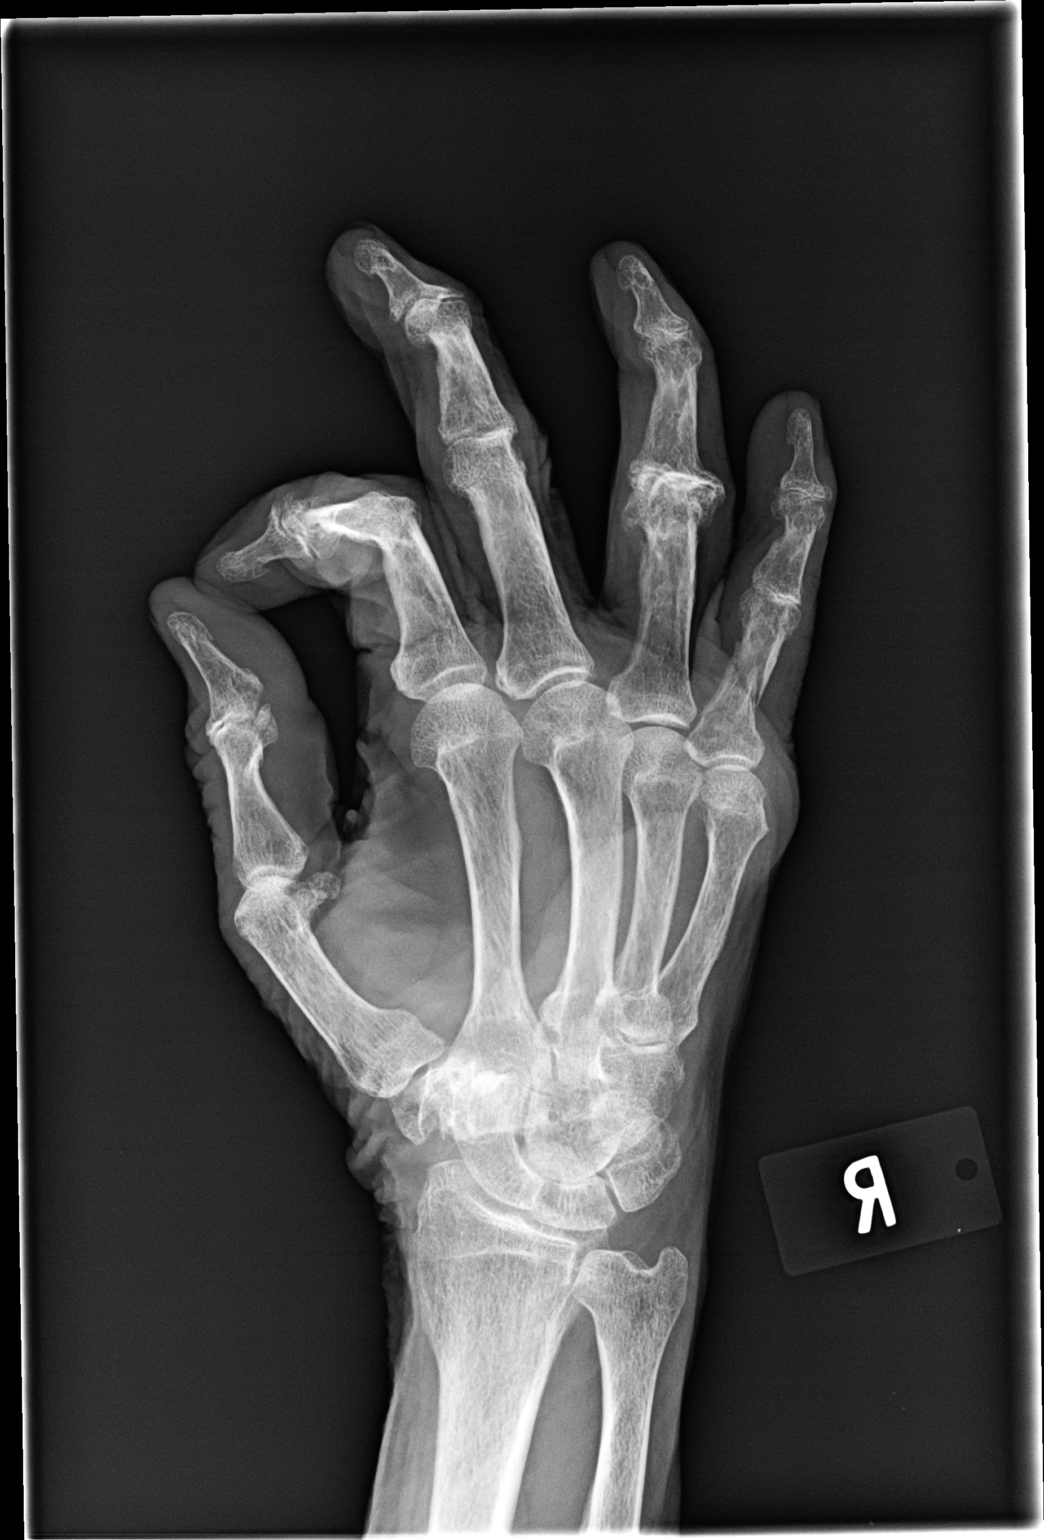

[view not recorded (3 of 3)]
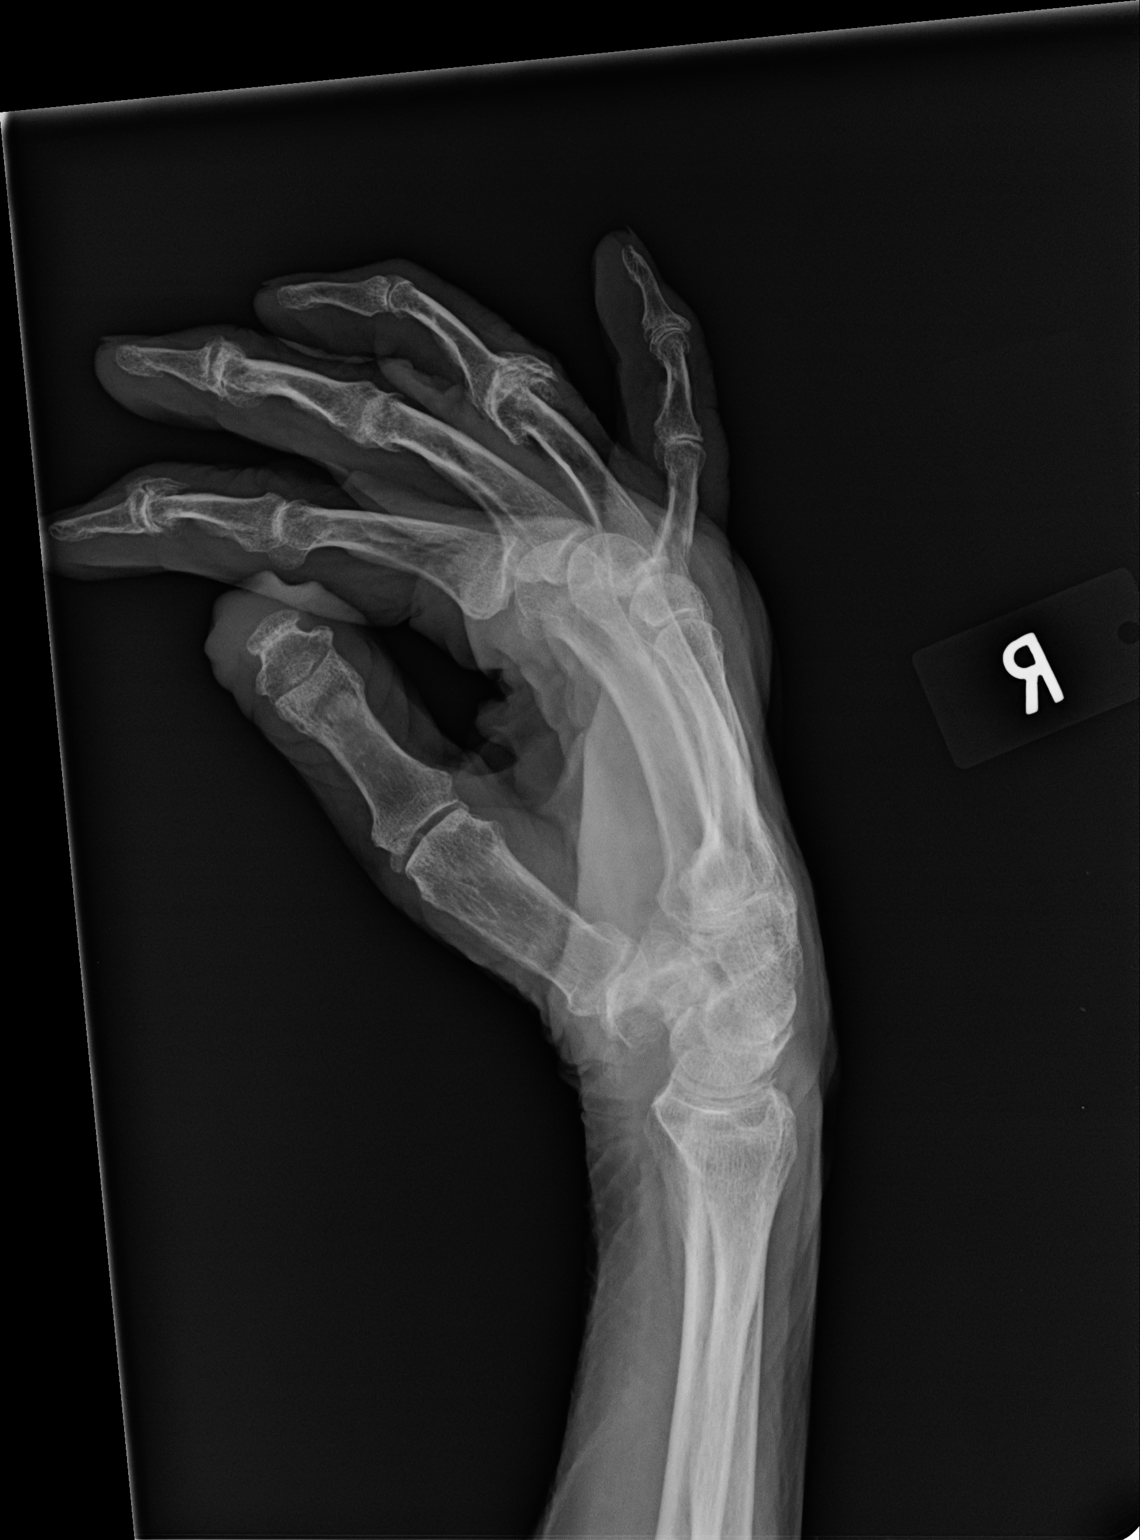

[3 of 3 positions shown; findings below may reference images not displayed]

FINDINGS: There is been some interval healing of the oblique fracture of the
proximal phalanx of the small finger. It remains mildly displaced
but stable in position. Advanced degenerative changes are stable.
IMPRESSION: Healing fracture involving the proximal phalanx of the small finger
